# Patient Record
Sex: Female | Born: 1960 | Race: Black or African American | Hispanic: No | Marital: Married | Smoking: Never smoker
Health system: Southern US, Community
[De-identification: ages and names within clinical notes are randomized; demographics above are authoritative.]

## PROBLEM LIST (undated history)

## (undated) HISTORY — PX: KNEE SURGERY: SHX244

## (undated) HISTORY — PX: FOOT SURGERY: SHX648

## (undated) HISTORY — PX: NM ESOPHAGEAL REFLUX: HXRAD613

---

## 2014-10-28 ENCOUNTER — Ambulatory Visit
Admission: RE | Admit: 2014-10-28 | Discharge: 2014-10-28 | Disposition: A | Discharge status: Home / Self Care | Payer: Federal, State, Local not specified - PPO | Source: Ambulatory Visit | Attending: Family Medicine | Admitting: Family Medicine

## 2014-10-28 ENCOUNTER — Other Ambulatory Visit: Payer: Self-pay | Admitting: Family Medicine

## 2014-10-28 DIAGNOSIS — R05 Cough: Secondary | ICD-10-CM

## 2014-10-28 DIAGNOSIS — R053 Chronic cough: Secondary | ICD-10-CM

## 2015-01-15 ENCOUNTER — Other Ambulatory Visit: Payer: Self-pay | Admitting: Family Medicine

## 2015-01-15 DIAGNOSIS — Z1231 Encounter for screening mammogram for malignant neoplasm of breast: Secondary | ICD-10-CM

## 2015-02-27 ENCOUNTER — Ambulatory Visit: Payer: Federal, State, Local not specified - PPO

## 2015-03-02 ENCOUNTER — Ambulatory Visit: Payer: Federal, State, Local not specified - PPO

## 2015-03-03 ENCOUNTER — Ambulatory Visit
Admission: RE | Admit: 2015-03-03 | Discharge: 2015-03-03 | Disposition: A | Discharge status: Home / Self Care | Payer: Federal, State, Local not specified - PPO | Source: Ambulatory Visit | Attending: Family Medicine | Admitting: Family Medicine

## 2015-03-03 DIAGNOSIS — Z1231 Encounter for screening mammogram for malignant neoplasm of breast: Secondary | ICD-10-CM

## 2015-04-13 ENCOUNTER — Ambulatory Visit (INDEPENDENT_AMBULATORY_CARE_PROVIDER_SITE_OTHER): Payer: Federal, State, Local not specified - PPO | Admitting: Podiatry

## 2015-04-13 ENCOUNTER — Encounter: Payer: Self-pay | Admitting: Podiatry

## 2015-04-13 VITALS — BP 142/90 | HR 74 | Resp 16 | Ht 68.0 in | Wt 230.0 lb

## 2015-04-13 DIAGNOSIS — M722 Plantar fascial fibromatosis: Secondary | ICD-10-CM

## 2015-04-13 DIAGNOSIS — M7732 Calcaneal spur, left foot: Secondary | ICD-10-CM | POA: Diagnosis not present

## 2015-04-13 DIAGNOSIS — M898X9 Other specified disorders of bone, unspecified site: Secondary | ICD-10-CM

## 2015-04-13 DIAGNOSIS — M799 Soft tissue disorder, unspecified: Secondary | ICD-10-CM

## 2015-04-13 DIAGNOSIS — M7989 Other specified soft tissue disorders: Secondary | ICD-10-CM

## 2015-04-13 NOTE — Progress Notes (Signed)
Subjective:    Patient ID: Daisy [name], female    DOB: 1960-10-28, 54 y.o.   MRN: [id number]  HPI Comments: "I was sent over by my other podiatrist"  Patient states that she is referred by Dr. Elijah Birk for a surgical consults of plantar fasciitis/heel spur and dorsal ganglion cyst left. She states that she's had pain in her left heel for several months. She also states that she has a heel spur to the area. She is tried multiple conservative treatments including multiple injections, CAM walkers, night splints, injections. She states it always treatment she gets temporary relief from the heel pain however it does recur. She also states that she has assistive the top of her left foot which shows steroid injection of the area does continue be painful particularly with shoe gear. She has tried offloading. The injection helped temporarily. At this time she is a pleasant surgical interventions for both of these issues due to continued pain. She is referred to me by Dr. Elijah Birk for consultation.  Foot Pain Associated symptoms include arthralgias.      Review of Systems  HENT: Positive for sinus pressure.   Eyes: Positive for redness and itching.  Gastrointestinal: Positive for abdominal distention.  Musculoskeletal: Positive for back pain and arthralgias.  Allergic/Immunologic: Positive for environmental allergies.  All other systems reviewed and are negative.      Objective:   Physical Exam AAO x3, NAD DP/PT pulses palpable bilaterally, CRT less than 3 seconds Protective sensation intact with Simms Weinstein monofilament, vibratory sensation intact, Achilles tendon reflex intact Tenderness to palpation overlying the plantar medial tubercle of the calcaneus to left heel at the insertion of the plantar fascia. There is no pain along the course of plantar fascia within the arch of the foot. The plantar fascia appears intact. There is no pain with lateral compression of the calcaneus or pain the vibratory  sensation. No pain on the posterior aspect of the calcaneus or along the course/insertion of the Achilles tendon.  On the dorsal aspect the left midfoot there is a small fluid-filled, mobile soft tissue mass present lateral to the dorsalis pedis artery. Underlying this area does appear to be a bony exostosis. There is tenderness palpation overlying this area. There is no overlying erythema or increase in warmth. No overlying skin changes.  There is no overlying edema, erythema, increase in warmth. No other areas of tenderness palpation or pain with vibratory sensation to the foot/ankle. MMT 5/5, ROM WNL No open lesions or pre-ulcerative lesions are identified. No pain with calf compression, swelling, warmth, erythema.      Assessment & Plan:  54 year old female with continued left foot plantar fasciitis, heel spur despite conservative treatment as well as left dorsal foot exostosis, soft tissue mass. -X-rays and Dr. Tasia Catchings office were reviewed with the patient. -Treatment options discussed including all alternatives, risks, and complications I discussed both conservative and surgical treatment options. At this time she is requesting surgical intervention as she is attended conservative treatment without any resolution. -Discussed with the patient left foot plantar fascial release with heel spur resection, dorsal foot exostectomy with soft tissue mass excision.  -The incision placement as well as the postoperative course was discussed with the patient. I discussed risks of the surgery which include, but not limited to, infection, bleeding, pain, swelling, need for further surgery, delayed or nonhealing, painful or ugly scar, numbness or sensation changes, over/under correction, recurrence, transfer lesions, further deformity, hardware failure, DVT/PE, loss of toe/foot. Patient understands these  risks and wishes to proceed with surgery. The surgical consent was reviewed with the patient all 3 pages were  signed. No promises or guarantees were given to the outcome of the procedure. All questions were answered to the best of my ability. Before the surgery the patient was encouraged to call the office if there is any further questions. The surgery will be performed at the [facility] on an outpatient basis.  Ovid CurdMatthew Saher Davee, DPM

## 2015-04-13 NOTE — Patient Instructions (Signed)
Pre-Operative Instructions  Congratulations, you have decided to take an important step to improving your quality of life.  You can be assured that the doctors of [facility] will be with you every step of the way.  1. Plan to be at the surgery center/hospital at least 1 (one) hour prior to your scheduled time unless otherwise directed by the surgical center/hospital staff.  You must have a responsible adult accompany you, remain during the surgery and drive you home.  Make sure you have directions to the surgical center/hospital and know how to get there on time. 2. For hospital based surgery you will need to obtain a history and physical form from your family physician within 1 month prior to the date of surgery- we will give you a form for you primary physician.  3. We make every effort to accommodate the date you request for surgery.  There are however, times where surgery dates or times have to be moved.  We will contact you as soon as possible if a change in schedule is required.   4. No Aspirin/Ibuprofen for one week before surgery.  If you are on aspirin, any non-steroidal anti-inflammatory medications (Mobic, Aleve, Ibuprofen) you should stop taking it 7 days prior to your surgery.  You make take Tylenol  For pain prior to surgery.  5. Medications- If you are taking daily heart and blood pressure medications, seizure, reflux, allergy, asthma, anxiety, pain or diabetes medications, make sure the surgery center/hospital is aware before the day of surgery so they may notify you which medications to take or avoid the day of surgery. 6. No food or drink after midnight the night before surgery unless directed otherwise by surgical center/hospital staff. 7. No alcoholic beverages 24 hours prior to surgery.  No smoking 24 hours prior to or 24 hours after surgery. 8. Wear loose pants or shorts- loose enough to fit over bandages, boots, and casts. 9. No slip on shoes, sneakers are best. 10. Bring  your boot with you to the surgery center/hospital.  Also bring crutches or a walker if your physician has prescribed it for you.  If you do not have this equipment, it will be provided for you after surgery. 11. If you have not been contracted by the surgery center/hospital by the day before your surgery, call to confirm the date and time of your surgery. 12. Leave-time from work may vary depending on the type of surgery you have.  Appropriate arrangements should be made prior to surgery with your employer. 13. Prescriptions will be provided immediately following surgery by your doctor.  Have these filled as soon as possible after surgery and take the medication as directed. 14. Remove nail polish on the operative foot. 15. Wash the night before surgery.  The night before surgery wash the foot and leg well with the antibacterial soap provided and water paying special attention to beneath the toenails and in between the toes.  Rinse thoroughly with water and dry well with a towel.  Perform this wash unless told not to do so by your physician.  Enclosed: 1 Ice pack (please put in freezer the night before surgery)   1 Hibiclens skin cleaner   Pre-op Instructions  If you have any questions regarding the instructions, do not hesitate to call our office.  [place]: [address] [place], [place] [address] [phone]  [place]: [address] [place], [place] [address] [phone]  [place]: [address]  [place], [place] [address] [phone]  Dr. Richard   Tuchman DPM, Dr. Norman Regal DPM Dr. Richard Sikora DPM, Dr. M. Todd Hyatt DPM, Dr. Kathryn Egerton DPM, Dr. Tanja Gift DPM 

## 2015-05-12 ENCOUNTER — Telehealth: Payer: Self-pay | Admitting: *Deleted

## 2015-05-12 NOTE — Telephone Encounter (Signed)
I left a message for patient to give me a call back.  I was returning her call.

## 2015-05-12 NOTE — Telephone Encounter (Signed)
"  I have an appointment for 09/07 that I need to get rescheduled.  Please give me a call at your earliest convenience.  Thank you."

## 2015-05-14 NOTE — Telephone Encounter (Signed)
"  I was just returning your call.  I was calling to get my surgery rescheduled that is scheduled for 09/07.  Call me at your earliest convenience, thank you."  I attempted to return her call.  I left a message that I was returning her call.

## 2015-05-14 NOTE — Telephone Encounter (Signed)
"  I'd like to move my surgery to sometime in October.  Can he do it on 07/06/2015?  I know that's a holiday and you may be closed."   We are working that day but he can't do it then.  He can do it on 07/08/2015.  His surgery day is on Wednesdays.  "Okay that is fine or I can do 07/01/2015.  Is that date available?"  Yes, it is available.  Would you like that date?  "Yes schedule me then.  What time will it be?"  The surgical center will call you a day or two prior to surgery date and give you the arrival time.  All I can tell you is that it will be sometime that morning.  I left Aram Beecham at the surgical center a message to reschedule patient's surgery from 06/03/2015 to 07/01/2015.

## 2015-06-08 ENCOUNTER — Encounter: Payer: Self-pay | Admitting: Podiatry

## 2015-06-19 ENCOUNTER — Encounter: Payer: Self-pay | Admitting: Podiatry

## 2015-07-01 DIAGNOSIS — M7732 Calcaneal spur, left foot: Secondary | ICD-10-CM

## 2015-07-01 DIAGNOSIS — M722 Plantar fascial fibromatosis: Secondary | ICD-10-CM

## 2015-07-02 ENCOUNTER — Telehealth: Payer: Self-pay | Admitting: *Deleted

## 2015-07-02 NOTE — Telephone Encounter (Signed)
Pt states Dr. Ardelle Anton did not speak with her husband, and they have some questions, and she would like a crutch. I spoke with pt she states she thought she would get a crutch, but didn't.  I told pt she should walk as normal as possible in the surgery shoe, not up on the surgery foot more than 5 minutes/hour and could use a cane if available or rent crutch for [employer].  Pt agreed.

## 2015-07-06 ENCOUNTER — Ambulatory Visit (INDEPENDENT_AMBULATORY_CARE_PROVIDER_SITE_OTHER): Payer: Federal, State, Local not specified - PPO | Admitting: Podiatry

## 2015-07-06 ENCOUNTER — Telehealth: Payer: Self-pay | Admitting: *Deleted

## 2015-07-06 ENCOUNTER — Encounter: Payer: Self-pay | Admitting: Podiatry

## 2015-07-06 ENCOUNTER — Ambulatory Visit (INDEPENDENT_AMBULATORY_CARE_PROVIDER_SITE_OTHER): Payer: Federal, State, Local not specified - PPO

## 2015-07-06 VITALS — BP 136/74 | HR 86 | Resp 18

## 2015-07-06 DIAGNOSIS — Z9889 Other specified postprocedural states: Secondary | ICD-10-CM

## 2015-07-06 DIAGNOSIS — M898X9 Other specified disorders of bone, unspecified site: Secondary | ICD-10-CM

## 2015-07-06 DIAGNOSIS — M722 Plantar fascial fibromatosis: Secondary | ICD-10-CM

## 2015-07-06 DIAGNOSIS — M7732 Calcaneal spur, left foot: Secondary | ICD-10-CM

## 2015-07-06 MED ORDER — OXYCODONE-ACETAMINOPHEN 5-325 MG PO TABS: 1.0000 | ORAL_TABLET | Freq: Four times a day (QID) | ORAL | Status: DC | PRN

## 2015-07-06 NOTE — Telephone Encounter (Signed)
Willis called states pt is in severe pain.  I called spoke with pt, she states the top of her foot hurts terribly.  I told pt to loosen the boot straps, and open up the boot and loosen the dressing and reapply the straps more comfortably, and she could take the Oxycodone 2 tablet every 6 hours, to remember she'd been up on that foot today and it would cause swelling, to rest more this evening.  Pt agreed.

## 2015-07-06 NOTE — Progress Notes (Signed)
Patient ID: Daisy [name], female   DOB: 1961-01-06, 54 y.o.   MRN: [id number]  DOS: 07/01/15 s/p Left plantar fasciotomy, heel spur resection   Subjective: 54 year old female presents to the office today 1 week s/p left foot surgery. She states she has continued to take the antibiotics as directed. She takes Percocet for pain which seems to help her pain. She does walk with a cane to help take pressure off the foot. She continues to wear the CAM boot. She does state that her leg continues to be somewhat numb for the nerve block. She denies any systemic complaints as fevers, chills, nausea, vomiting. Denies any calf pain, chest pain, shortness of breath. No other complaints at this time in no acute changes otherwise.  Objective: AAO 3, NAD DP/PT pulses palpable, CRT less than 3 seconds Protective sensation intact with Simms Weinstein monofilament, vibratory sensation intact, Achilles tendon reflex intact. Incision on both the dorsal aspect of the left foot as well as the medial aspect of the left barefoot as well coapted without any evidence of dehiscence and sutures are intact. There is no surrounding erythema, ascending cellulitis, fluctuance, crepitus, malodor, drainage/purulence. There is localized edema around the surgical sites and mild tenderness to palpation about surgical sites. There is no other areas of tenderness to bilateral lower extremities. No other areas of edema, erythema, increase in warmth. No open lesions or pre-ulcerative lesions bilaterally. There is no pain with calf compression, swelling, warmth, erythema.  Assessment: 54 year old female 1 week status post left foot surgery  Plan: -X-rays were obtained and reviewed with the patient.  -Treatment options discussed including all alternatives, risks, and complications -Antibiotic ointments placed over the incisions followed by dry sterile dressing. Keep dressing clean, dry, intact. -At her request crutches were ordered. She'll be  partial weightbearing or she needs to be nonweightbearing. -Continue ice and elevation -Pian medication as needed -Monitor for any clinical signs or symptoms of infection and directed to call the office immediately should any occur or go to the ER. -Follow-up in 1 week for likely suture removal or sooner if any problems arise. In the meantime, encouraged to call the office with any questions, concerns, change in symptoms.   Ovid Curd, DPM

## 2015-07-17 ENCOUNTER — Ambulatory Visit (INDEPENDENT_AMBULATORY_CARE_PROVIDER_SITE_OTHER): Payer: Federal, State, Local not specified - PPO | Admitting: Podiatry

## 2015-07-17 ENCOUNTER — Encounter: Payer: Self-pay | Admitting: Podiatry

## 2015-07-17 VITALS — BP 134/80 | HR 82 | Resp 18

## 2015-07-17 DIAGNOSIS — Z9889 Other specified postprocedural states: Secondary | ICD-10-CM

## 2015-07-17 DIAGNOSIS — M722 Plantar fascial fibromatosis: Secondary | ICD-10-CM

## 2015-07-17 MED ORDER — OXYCODONE-ACETAMINOPHEN 5-325 MG PO TABS: 1.0000 | ORAL_TABLET | Freq: Four times a day (QID) | ORAL | Status: DC | PRN

## 2015-07-23 NOTE — Progress Notes (Signed)
Patient ID: Daisy [name], female   DOB: 09/15/1961, 54 y.o.   MRN: [id number]  DOS: 07/01/15 s/p Left plantar fasciotomy, heel spur resection   Subjective: 54 year old female presents to the office today 2 weeks s/p left foot surgery. She states that overall she is doing well and her pain is continuing to improve. She continues to get some swelling overlying the area as well as some localized numbness and tingling along the surgical site. She continues take pain medication as needed. She denies any systemic complaints such as fevers, chills, nausea, vomiting. Denies any calf pain, chest pain, shortness of breath. No other complaints at this time in no acute changes otherwise.  Objective: AAO 3, NAD DP/PT pulses palpable, CRT less than 3 seconds Protective sensation intact with Simms Weinstein monofilament Incision on both the dorsal aspect of the left foot as well as the medial aspect of the left foot are well coapted without any evidence of dehiscence and sutures are intact. There is no surrounding erythema, ascending cellulitis, fluctuance, crepitus, malodor, drainage/purulence. There is localized edema around the surgical sites and mild tenderness to palpation about surgical sites which continues but appears to be somewhat improved compared to last appointment. There are no other areas of tenderness to bilateral lower extremities. No other areas of edema, erythema, increase in warmth. No open lesions or pre-ulcerative lesions bilaterally. There is no pain with calf compression, swelling, warmth, erythema.  Assessment: 54 year old female 2 weeks status post left foot surgery  Plan: -Treatment options discussed including all alternatives, risks, and complications -Sutures were removed. Antibiotic ointments placed over the incisions followed by dry sterile dressing. Keep dressing clean, dry, intact.she can start to shower as long as the incision remains closed. At the same prominence to hold off on  showing call the office. -continue weightbearing as tolerated in the boot. Wear the boot all times, even at night. -Continue ice and elevation -Pian medication as needed -Monitor for any clinical signs or symptoms of infection and directed to call the office immediately should any occur or go to the ER. -Follow-up in 3 weeks or sooner if any problems arise. In the meantime, encouraged to call the office with any questions, concerns, change in symptoms.   Ovid CurdMatthew Ebb Carelock, DPM

## 2015-08-05 ENCOUNTER — Telehealth: Payer: Self-pay | Admitting: *Deleted

## 2015-08-05 MED ORDER — OXYCODONE-ACETAMINOPHEN 5-325 MG PO TABS: 1.0000 | ORAL_TABLET | Freq: Four times a day (QID) | ORAL | Status: DC | PRN

## 2015-08-05 MED ORDER — PROMETHAZINE HCL 25 MG PO TABS: 25.0000 mg | ORAL_TABLET | Freq: Four times a day (QID) | ORAL | Status: DC | PRN

## 2015-08-05 NOTE — Telephone Encounter (Signed)
OK to refill

## 2015-08-05 NOTE — Telephone Encounter (Addendum)
Pt request refill of Oxycodone and Phenergan.  Informed pt Dr. Ardelle AntonWagoner had refilled and she would need to pick [facility], pt states she will pick up tomorrow.

## 2015-08-07 ENCOUNTER — Encounter: Payer: Federal, State, Local not specified - PPO | Admitting: Podiatry

## 2015-08-14 ENCOUNTER — Ambulatory Visit (INDEPENDENT_AMBULATORY_CARE_PROVIDER_SITE_OTHER): Payer: Federal, State, Local not specified - PPO | Admitting: Podiatry

## 2015-08-14 ENCOUNTER — Ambulatory Visit: Payer: Federal, State, Local not specified - PPO

## 2015-08-14 ENCOUNTER — Encounter: Payer: Self-pay | Admitting: Podiatry

## 2015-08-14 VITALS — BP 151/99 | HR 80 | Temp 99.3°F | Resp 12

## 2015-08-14 DIAGNOSIS — Z9889 Other specified postprocedural states: Secondary | ICD-10-CM

## 2015-08-14 DIAGNOSIS — M722 Plantar fascial fibromatosis: Secondary | ICD-10-CM

## 2015-08-14 DIAGNOSIS — M79673 Pain in unspecified foot: Secondary | ICD-10-CM

## 2015-08-14 MED ORDER — CEPHALEXIN 500 MG PO CAPS: 500.0000 mg | ORAL_CAPSULE | Freq: Three times a day (TID) | ORAL | Status: DC

## 2015-08-16 DIAGNOSIS — Z9889 Other specified postprocedural states: Secondary | ICD-10-CM | POA: Insufficient documentation

## 2015-08-16 DIAGNOSIS — M722 Plantar fascial fibromatosis: Secondary | ICD-10-CM | POA: Insufficient documentation

## 2015-08-16 NOTE — Progress Notes (Signed)
Patient ID: Daisy [name], female   DOB: 04/04/1961, 54 y.o.   MRN: [id number]  Subjective: Daisy [name] is a 54 y.o. is seen today in office s/p left plantar fasciotomy and heel spur resection as well as dorsal exostectomy preformed on 07/01/15. She states that she is still having some pain on the surgical site. She is also had increased swelling around the incision into the bottom of her foot. She has continued with the cam boot. She did notice some clear drainage coming from the incision but denies any pus. Denies any increase in warmth or redness around the incision which of the foot. No red streaks. Denies any systemic complaints such as fevers, chills, nausea, vomiting. No calf pain, chest pain, shortness of breath.   Objective: General: No acute distress, AAOx3  DP/PT pulses palpable 2/4, CRT < 3 sec to all digits.  Protective sensation intact. Motor function intact.  Left foot: Incision on the medial aspect of the foot has what appears to be a very small superficial granular wound which almost appears that the scab has fallen off and there is left with a small superficial wound. Also there is increased edema around the medial aspect of the heel on the surgical site into the plantar aspect of the heel and due to the swelling may open the incision somewhat. There was no drainage or pus expressed today. There is tenderness palpation overlying this area. There is no erythema or increase in warmth. There is no red streaks. No malodor.  Incision on the dorsal aspect of the left foot as well coapted without any evidence of dehiscence and a scars forming. There is no significant edema around this area or surrounding erythema, increase in warmth. There is no drainage or pus. No other areas of tenderness to bilateral lower extremities.  No other open lesions or pre-ulcerative lesions.  No pain with calf compression, swelling, warmth, erythema.   Assessment and Plan:  Status post left foot surgery, with  increased swelling/tenderness overlying the medial incision  -Treatment options discussed including all alternatives, risks, and complications -Antibiotic ointment was placed over the incision followed by a dressing. An Unna boot was applied. She'll remove the boot on Tuesday [name] or sooner if there is any swelling, increasing pain, discoloration of the toes. If there is any worsening of the incision or there is any signs or symptoms of infection to call the office as I'm in the office Tuesday afternoon. She can always come in before this if needed as well. -Ice/elevation -Pain medication as needed. -Monitor for any clinical signs or symptoms of infection and DVT/PE and directed to call the office immediately should any occur or go to the ER. -Follow-up in 1 week or sooner if any problems arise. In the meantime, encouraged to call the office with any questions, concerns, change in symptoms.   Ovid CurdMatthew Corney Knighton, DPM

## 2015-08-17 ENCOUNTER — Telehealth: Payer: Self-pay | Admitting: *Deleted

## 2015-08-17 MED ORDER — OXYCODONE-ACETAMINOPHEN 5-325 MG PO TABS: 1.0000 | ORAL_TABLET | Freq: Four times a day (QID) | ORAL | Status: DC | PRN

## 2015-08-17 NOTE — Telephone Encounter (Addendum)
Pt request refill of Oxycodone.  Dr. Ardelle AntonWagoner ordered refill as previously.  Informed pt she would need to pick up the rx in the [facility] [place].  Pt states has removed the medicated wrap and the foot is still swollen, but not as bad, the area is no move closed than last time and there is clear drainage.  Dr. Ardelle AntonWagoner states continue the epsom salt soaks, compression, elevation and ice, continue the Cephalexin and begin Voltaren 75mg  #60 1 tablet bid.  Orders to pt and pharmacy.

## 2015-08-18 MED ORDER — DICLOFENAC SODIUM 75 MG PO TBEC: 75.0000 mg | DELAYED_RELEASE_TABLET | Freq: Two times a day (BID) | ORAL | Status: DC

## 2015-08-24 ENCOUNTER — Encounter: Payer: Self-pay | Admitting: Podiatry

## 2015-08-24 ENCOUNTER — Ambulatory Visit (INDEPENDENT_AMBULATORY_CARE_PROVIDER_SITE_OTHER): Payer: Federal, State, Local not specified - PPO | Admitting: Podiatry

## 2015-08-24 DIAGNOSIS — M722 Plantar fascial fibromatosis: Secondary | ICD-10-CM

## 2015-08-24 DIAGNOSIS — M898X9 Other specified disorders of bone, unspecified site: Secondary | ICD-10-CM

## 2015-08-24 DIAGNOSIS — Z9889 Other specified postprocedural states: Secondary | ICD-10-CM

## 2015-08-24 MED ORDER — OXYCODONE-ACETAMINOPHEN 5-325 MG PO TABS: 1.0000 | ORAL_TABLET | Freq: Four times a day (QID) | ORAL | Status: DC | PRN

## 2015-08-24 MED ORDER — METHYLPREDNISOLONE 4 MG PO TBPK: ORAL_TABLET | ORAL | Status: DC

## 2015-08-25 NOTE — Progress Notes (Signed)
Patient ID: Daisy GoldsVeda Cuneo, female   DOB: 06/06/1961, 54 y.o.   MRN: [id number]  Subjective: Daisy [name] is a 54 y.o. is seen today in office s/p left plantar fasciotomy and heel spur resection as well as dorsal exostectomy preformed on 07/01/15. She states that she is still having some pain on the surgical site although it has improved compared to last appointment. She is also had decreased swelling around the incision into the bottom of her foot since last appointment. She has continued with the cam boot. She states the drainage has stopped along the incision. She denies any surrounding redness or increase in warmth around the incision or to the foot/ankle. No red streaks. She has one more day for antibiotic. Denies any systemic complaints such as fevers, chills, nausea, vomiting. No calf pain, chest pain, shortness of breath.   Objective: General: No acute distress, AAOx3  DP/PT pulses palpable 2/4, CRT < 3 sec to all digits.  Protective sensation intact. Motor function intact.  Left foot: Incision on the medial aspect of the foot has what appears to coapted at this time there is no evidence of dehiscence. There is no drainage or pus expressed today. There is tenderness palpation overlying this area but subjectively is improving. There is no erythema or increase in warmth. There is no red streaks. No malodor.  there is decreased edema along the surgical sites the plantar fascia.  Incision on the dorsal aspect of the left foot as well coapted without any evidence of dehiscence and a scars forming. There is no significant edema around this area or surrounding erythema, increase in warmth. There is no drainage or pus. No other areas of tenderness to bilateral lower extremities.  No other open lesions or pre-ulcerative lesions.  No pain with calf compression, swelling, warmth, erythema.   Assessment and Plan:  Status post left foot surgery, with decreasingling/tenderness overlying the medial  incision  -Treatment options discussed including all alternatives, risks, and complications -Antibiotic ointment was placed over the incision followed by a dressing. An Unna boot was applied. She'll remove the boot in 5 days or sooner if there is any swelling, increasing pain, discoloration of the toes. If there is any worsening of the incision or there is any signs or symptoms of infection to call the office as I'm in the office Tuesday afternoon. She can always come in before this if needed as well. -Ice/elevation -Pain medication as needed. -Medrol dose pack -Rx PT -Monitor for any clinical signs or symptoms of infection and DVT/PE and directed to call the office immediately should any occur or go to the ER. -Follow-up in 2 weeks or sooner if any problems arise. In the meantime, encouraged to call the office with any questions, concerns, change in symptoms.   Ovid CurdMatthew Wagoner, DPM

## 2015-08-28 ENCOUNTER — Telehealth: Payer: Self-pay | Admitting: Podiatry

## 2015-08-28 NOTE — Telephone Encounter (Signed)
I called and left a message for the patient to let her know that her medical records are ready. I told her she has a fee of $6.75 to pay for them. I asked her to call and let me know if she wants to come pick them up or if she prefers I mail them to her.

## 2015-09-11 ENCOUNTER — Encounter: Payer: Self-pay | Admitting: Podiatry

## 2015-09-11 ENCOUNTER — Ambulatory Visit (INDEPENDENT_AMBULATORY_CARE_PROVIDER_SITE_OTHER): Payer: Federal, State, Local not specified - PPO | Admitting: Podiatry

## 2015-09-11 VITALS — BP 141/92 | HR 80 | Resp 18

## 2015-09-11 DIAGNOSIS — M722 Plantar fascial fibromatosis: Secondary | ICD-10-CM

## 2015-09-11 DIAGNOSIS — Z9889 Other specified postprocedural states: Secondary | ICD-10-CM

## 2015-09-11 DIAGNOSIS — M898X9 Other specified disorders of bone, unspecified site: Secondary | ICD-10-CM

## 2015-09-11 MED ORDER — OXYCODONE-ACETAMINOPHEN 5-325 MG PO TABS: 1.0000 | ORAL_TABLET | Freq: Four times a day (QID) | ORAL | Status: DC | PRN

## 2015-09-11 NOTE — Telephone Encounter (Signed)
Patient has a scheduled appointment today 09/11/2015 at 10:45 with Dr. Ardelle AntonWagoner. I let them know she needs to pay $6.75 for her records and I scanned a [facility] of them [facility] for her to print out to give to the patient since I never heard back from the patient.

## 2015-09-14 DIAGNOSIS — M898X9 Other specified disorders of bone, unspecified site: Secondary | ICD-10-CM | POA: Insufficient documentation

## 2015-09-14 NOTE — Progress Notes (Signed)
Patient ID: Daisy [name], female   DOB: 09/08/1961, 54 y.o.   MRN: [id number]  Subjective: Daisy [name] is a 54 y.o. is seen today in office s/p left plantar fasciotomy and heel spur resection as well as dorsal exostectomy preformed on 07/01/15. She states of the swan the pain has decreased all as she does continue to have pain mostly on the medial incision. She does start physical therapy on Monday. She went from her evaluation is past week. She does continue to anklet the CAM boot. She has had no further drainage coming from the incision. Denies any redness or red streaks. She is really not taking antibiotics. Denies any systemic complaints such as fevers, chills, nausea, vomiting. No calf pain, chest pain, shortness of breath.   Objective: General: No acute distress, AAOx3  DP/PT pulses palpable 2/4, CRT < 3 sec to all digits.  Protective sensation intact. Motor function intact.  Left foot: Incision on the medial aspect of the foot has what appears to coapted at this time there is no evidence of dehiscence and a scar has formed. There is no drainage or pus expressed today. There is tenderness palpation overlying this area but improved. There is no erythema or increase in warmth. There is no red streaks. No malodor.  There is decreased edema. Incision on the dorsal aspect of the left foot as well coapted without any evidence of dehiscence and a scars forming. There is no significant edema around this area or surrounding erythema, increase in warmth. There is no drainage or pus. No other areas of tenderness to bilateral lower extremities.  No other open lesions or pre-ulcerative lesions.  No pain with calf compression, swelling, warmth, erythema.   Assessment and Plan:  Status post left foot surgery, with decreasingling/tenderness overlying the medial incision  -Treatment options discussed including all alternatives, risks, and complications -Recommended rehabilitation exercises home. She starts  physical therapy on Monday. Continuing the CAM boot. She inserted transition out of the CAM boot back into a regular shoe as tolerated. With the boot even at night or the night splint. Ice and elevation. Pain medication as needed. She recently completed a Medrol Dosepak without any couple complications. Follow up in 3 weeks or sooner if any problems are to arise. Call any questions or concerns. Monitor for any clinical signs or symptoms of infection and directed to call the office immediately should any occur or go to the ER.  Ovid CurdMatthew Wagoner, DPM

## 2015-09-21 NOTE — Telephone Encounter (Signed)
Entered in error

## 2015-09-24 ENCOUNTER — Encounter: Payer: Self-pay | Admitting: Podiatry

## 2015-09-24 NOTE — Progress Notes (Signed)
DOS 07-01-15  Plantar fasciotomy left, heel spur resection left, tarsal exostectomy left  Rx'd Keflex 500 mg TID #21, Percocet 5/325 #30, Phenergan 25 mg #30

## 2015-10-06 ENCOUNTER — Encounter: Payer: Self-pay | Admitting: Podiatry

## 2015-10-06 ENCOUNTER — Ambulatory Visit (INDEPENDENT_AMBULATORY_CARE_PROVIDER_SITE_OTHER): Payer: Federal, State, Local not specified - PPO | Admitting: Podiatry

## 2015-10-06 VITALS — BP 134/91 | HR 79 | Resp 18

## 2015-10-06 DIAGNOSIS — M898X9 Other specified disorders of bone, unspecified site: Secondary | ICD-10-CM

## 2015-10-06 DIAGNOSIS — Z9889 Other specified postprocedural states: Secondary | ICD-10-CM

## 2015-10-06 DIAGNOSIS — M722 Plantar fascial fibromatosis: Secondary | ICD-10-CM | POA: Diagnosis not present

## 2015-10-06 NOTE — Progress Notes (Signed)
Patient ID: Daisy [name], female   DOB: 05/17/1961, 55 y.o.   MRN: [id number]  Subjective: Daisy GoldsVeda Digiacomo is a 55 y.o. is seen today in office s/p left plantar fasciotomy and heel spur resection as well as dorsal exostectomy preformed on 07/01/15. She states that she feels that she is improving. She's been going to physical therapy which has been helping. She does continue the cam boot she does sleep in the cam boot at night. She doesn't that her leg is still not since the surgery as well. It has slowly been getting better. Denies any systemic complaints such as fevers, chills, nausea, vomiting. No calf pain, chest pain, shortness of breath.   Objective: General: No acute distress, AAOx3  DP/PT pulses palpable 2/4, CRT < 3 sec to all digits.  Protective sensation intact. Motor function intact.  Left foot: Incision on the medial aspect of the foot appears to coapted at this time there is no evidence of dehiscence and a scar has formed. There is no drainage or pus expressed  No open sores. Tenderness over the incision does appear to be decreased. There is also decreased tenderness of plantar aspect of the heel along the plantar fascia and the calcaneus. There doesn't appear to be some subjective numbness overlying the distal leg.  Incision on the dorsal aspect of the midfoot from  exostectomy is well-healed and there is no tenderness upon area.There is mild edema to the foot however does appear to be improved. No other areas of tenderness to bilateral lower extremities. No other open lesions or pre-ulcerative lesions.  No pain with calf compression, swelling, warmth, erythema.   Assessment and Plan:  Status post left foot surgery, with improving symptoms although discontinued have numbness and tingling to the leg after nerve block.  -Treatment options discussed including all alternatives, risks, and complications -At this time I do recommend he continue with physical therapy. Continue the cam boot as needed  she can start to transition to regular shoe as tolerated. Dispensed compression anklet to help with swelling.  She did finish the Medrol Dosepak which helps. She did not get the compound cream which I ordered previously I will reorder this. Faxed to [facility]. She cannot take anti-inflammatories. -Will speak with anesthesia about the nerve sensations to the leg from the nerve block. -Continue to sleep with night splint or CAM boot. Dispensed night splint today. -Monitor for any clinical signs or symptoms of infection and directed to call the office immediately should any occur or go to the ER. -Follow-up in 3 weeks or sooner if any problems arise. In the meantime, encouraged to call the office with any questions, concerns, change in symptoms.   Ovid CurdMatthew Wagoner, DPM

## 2015-10-07 ENCOUNTER — Telehealth: Payer: Self-pay | Admitting: *Deleted

## 2015-10-07 MED ORDER — NONFORMULARY OR COMPOUNDED ITEM: Status: DC

## 2015-10-07 NOTE — Telephone Encounter (Signed)
Dr. Ardelle AntonWagoner ordered [facility] compound - Achilles Tendonitis Cream.  Faxed.

## 2015-10-07 NOTE — Telephone Encounter (Signed)
OK to do the neuropathy cream.

## 2015-10-07 NOTE — Telephone Encounter (Addendum)
[facility] states [employer] insurance will pay more if the peripheral neuropathy compound is ordered, Peripheral Neuropathy compound containing Bupivacaine 1%, Doxepin 3%, Gabapentin 6%, Pentoxifylline 3%, Topiramate 1%.  10/08/2015 - Dr. Ardelle AntonWagoner states may change to Peripheral Neuropathy cream.  [facility]

## 2015-10-08 MED ORDER — NONFORMULARY OR COMPOUNDED ITEM: Status: DC

## 2015-10-30 ENCOUNTER — Encounter: Payer: Self-pay | Admitting: Podiatry

## 2015-10-30 ENCOUNTER — Ambulatory Visit (INDEPENDENT_AMBULATORY_CARE_PROVIDER_SITE_OTHER): Payer: Federal, State, Local not specified - PPO | Admitting: Podiatry

## 2015-10-30 VITALS — BP 128/77 | HR 76 | Resp 18

## 2015-10-30 DIAGNOSIS — M722 Plantar fascial fibromatosis: Secondary | ICD-10-CM | POA: Diagnosis not present

## 2015-10-30 DIAGNOSIS — M898X9 Other specified disorders of bone, unspecified site: Secondary | ICD-10-CM

## 2015-10-30 DIAGNOSIS — Z9889 Other specified postprocedural states: Secondary | ICD-10-CM

## 2015-10-30 DIAGNOSIS — M79672 Pain in left foot: Secondary | ICD-10-CM | POA: Diagnosis not present

## 2015-10-31 NOTE — Progress Notes (Signed)
Patient ID: Daisy [name], female   DOB: August 13, 1961, 55 y.o.   MRN: [id number]  Subjective: Daisy [name] is a 55 y.o. is seen today in office s/p left plantar fasciotomy and heel spur resection as well as dorsal exostectomy preformed on 07/01/15. She has been going to physical therapy since her last appointment. She feels that the physical therapy is helping her quite a bit and she does feel that she is making steady improvement. She does continue with the cam boot. She feels that she does get pain to the arch of her foot as well. The pain to her heels improving. She does continue sleep the night splint. Denies any systemic complaints such as fevers, chills, nausea, vomiting. No calf pain, chest pain, shortness of breath.   Objective: General: No acute distress, AAOx3  DP/PT pulses palpable 2/4, CRT < 3 sec to all digits.  Protective sensation intact. Motor function intact.  Left foot: Incision on the medial aspect of the foot appears to coapted at this time there is no evidence of dehiscence and a scar has formed. There is no drainage or pus expressed  No open sores. Tenderness over the incision does appear to be continuing to improve. There is also decreased tenderness of plantar aspect of the heel along the plantar fascia and the calcaneus. There is some tenderness along the plantar aspect of the foot on the medial arch. There does appear to be some subjective numbness overlying the distal leg.   Incision on the dorsal aspect of the midfoot from  exostectomy is well-healed and there is no tenderness upon area.There is mild edema to the foot however does appear to be improved. No other areas of tenderness to bilateral lower extremities. No other open lesions or pre-ulcerative lesions.  No pain with calf compression, swelling, warmth, erythema.   Assessment and Plan:  Status post left foot surgery, with improving symptoms although discontinued have numbness and tingling to the leg after nerve  block.  -Treatment options discussed including all alternatives, risks, and complications -Anesthesia to call the patient and discussed that numbness after the nerve block. We'll continue to monitor. -Continue with physical therapy. -She started transition to regular shoe as tolerated. I do believe that should benefit from orthotics. She has not been wearing them. She was scanned for orthotics today and they were sent to [facility] labs. -Ice and elevation -Anti-inflammatories. -Follow-up in 4 weeks or sooner if any problems arise. In the meantime, encouraged to call the office with any questions, concerns, change in symptoms.   Ovid Curd, DPM

## 2015-11-13 ENCOUNTER — Telehealth: Payer: Self-pay | Admitting: *Deleted

## 2015-11-13 ENCOUNTER — Encounter: Payer: Self-pay | Admitting: *Deleted

## 2015-11-13 NOTE — Telephone Encounter (Signed)
Pt states needs a note because she was unable to work yesterday due to foot pain. I spoke with pt and she states that she was out 1/2 day Monday, all day Tuesday and Thursday.  Dr. Bary Castilla the note. Pt is transferred to schedulers to see if she can be scheduled earlier than 11/20/2015 with Dr. Ardelle Anton.

## 2015-11-20 ENCOUNTER — Ambulatory Visit (INDEPENDENT_AMBULATORY_CARE_PROVIDER_SITE_OTHER): Payer: Federal, State, Local not specified - PPO | Admitting: Podiatry

## 2015-11-20 ENCOUNTER — Encounter: Payer: Self-pay | Admitting: Podiatry

## 2015-11-20 VITALS — BP 131/84 | HR 79 | Resp 18

## 2015-11-20 DIAGNOSIS — Z9889 Other specified postprocedural states: Secondary | ICD-10-CM

## 2015-11-20 DIAGNOSIS — M898X9 Other specified disorders of bone, unspecified site: Secondary | ICD-10-CM

## 2015-11-20 DIAGNOSIS — M722 Plantar fascial fibromatosis: Secondary | ICD-10-CM

## 2015-11-20 MED ORDER — METHYLPREDNISOLONE 4 MG PO TBPK: ORAL_TABLET | ORAL | Status: DC

## 2015-11-26 NOTE — Progress Notes (Signed)
Patient ID: Daisy [name], female   DOB: 12/01/1960, 55 y.o.   MRN: [id number]  Subjective: Daisy [name] is a 55 y.o. is seen today in office s/p left plantar fasciotomy and heel spur resection as well as dorsal exostectomy preformed on 07/01/15. She states that overall she feels that she is making steady improvement. She is continuing to a regular shoe and continue with rehabilitation exercises. She also presents a pickup orthotics.Denies any systemic complaints such as fevers, chills, nausea, vomiting. No calf pain, chest pain, shortness of breath.   Objective: General: No acute distress, AAOx3  DP/PT pulses palpable 2/4, CRT < 3 sec to all digits.  Protective sensation intact. Motor function intact.  Left foot: Incision on the medial aspect of the foot appears to coapted at this time there is no evidence of dehiscence and a scar has formed. There is no drainage or pus expressed  No open sores. Tenderness over the incision does appear to be continuing to improve. There is also decreased tenderness of plantar aspect of the heel along the plantar fascia. There is decreased tenderness along the plantar aspect of the foot on the medial arch.  ncision on the dorsal aspect of the midfoot from  exostectomy is well-healed and there is no tenderness upon area.There is mild edema to the foot however does appear to be improved. No other areas of tenderness to bilateral lower extremities. No other open lesions or pre-ulcerative lesions.  No pain with calf compression, swelling, warmth, erythema.   Assessment and Plan:  Status post left foot surgery, with improving symptoms  -Treatment options discussed including all alternatives, risks, and complications -Continue with physical therapy. -Continue regular shoe gear. Dispensed orthotics today oral and break in instructions were discussed the patient. -Anti-inflammatories as needed -Ice and elevation -Follow-up in 4 weeks or sooner if any problems arise. In the  meantime, encouraged to call the office with any questions, concerns, change in symptoms.   Ovid Curd, DPM

## 2015-12-18 ENCOUNTER — Encounter: Payer: Self-pay | Admitting: Podiatry

## 2015-12-18 ENCOUNTER — Ambulatory Visit (INDEPENDENT_AMBULATORY_CARE_PROVIDER_SITE_OTHER): Payer: Federal, State, Local not specified - PPO | Admitting: Podiatry

## 2015-12-18 VITALS — BP 123/76 | HR 69 | Resp 12

## 2015-12-18 DIAGNOSIS — M722 Plantar fascial fibromatosis: Secondary | ICD-10-CM | POA: Diagnosis not present

## 2015-12-18 DIAGNOSIS — M792 Neuralgia and neuritis, unspecified: Secondary | ICD-10-CM

## 2015-12-20 NOTE — Progress Notes (Signed)
Patient ID: Daisy [name], female   DOB: 03/02/1961, 55 y.o.   MRN: [id number]  Subjective: Daisy [name] is a 55 y.o. is seen today in office s/p left plantar fasciotomy and heel spur resection as well as dorsal exostectomy preformed on 07/01/15. She feels that she is making some steady improvement compared to last appointment. She is still going to physical therapy and she doesn't this has been helping. She is wearing a regular shoe although she does get some pain to the bottom of her heel as well as numbness to her foot and leg. I will this may be from the nerve block and anesthesia has discussed this with her as well. She occasional feels that she gets some sharp shooting pain to her foot and some occasional numbness. No recent injury or trauma. Denies any systemic complaints such as fevers, chills, nausea, vomiting. No calf pain, chest pain, shortness of breath.   Objective: General: No acute distress, AAOx3  DP/PT pulses palpable 2/4, CRT < 3 sec to all digits.  Protective sensation intact. Motor function intact.  Left foot: Incision is well-healed the scar is formed. There is decreased edema on the area. There is continued tenderness palpation along the plantar heel along the surgical site. There is no erythema or increase in warmth. There is no drainage. There is no pain lateral compression of the calcaneus. There is no other area of tenderness to bilateral lower extremities. No other open lesions or pre-ulcerative lesions.  No pain with calf compression, swelling, warmth, erythema.   Assessment and Plan:  Status post left foot surgery, with slowly improving symptoms.  -Treatment options discussed including all alternatives, risks, and complications -Due to the continued pain and discussed steroid injection to the area. She wishes to proceed. Under sterile conditions a mixture of Kenalog and local anesthetic was infiltrated into the area of maximal tenderness along the surgical site the plantar  medial heel. She tolerated injection well any complications. Post injection care was discussed. -Continue stretching, icing, physical therapy. -Given the nerve symptoms I discussed starting treatment for neuropathy/nerve symptoms. She was talking or therapy. We'll start with a compound cream. This is ordered today. -Follow-up in 4 weeks or sooner if any problems arise. In the meantime, encouraged to call the office with any questions, concerns, change in symptoms.   Ovid CurdMatthew Katena Petitjean, DPM

## 2016-01-13 ENCOUNTER — Encounter: Payer: Self-pay | Admitting: Podiatry

## 2016-01-13 ENCOUNTER — Encounter: Payer: Self-pay | Admitting: *Deleted

## 2016-01-13 ENCOUNTER — Ambulatory Visit (INDEPENDENT_AMBULATORY_CARE_PROVIDER_SITE_OTHER): Payer: Federal, State, Local not specified - PPO | Admitting: Podiatry

## 2016-01-13 VITALS — BP 128/83 | HR 89 | Resp 18

## 2016-01-13 DIAGNOSIS — M779 Enthesopathy, unspecified: Secondary | ICD-10-CM

## 2016-01-13 DIAGNOSIS — M25572 Pain in left ankle and joints of left foot: Secondary | ICD-10-CM | POA: Diagnosis not present

## 2016-01-15 ENCOUNTER — Ambulatory Visit: Payer: Federal, State, Local not specified - PPO | Admitting: Podiatry

## 2016-01-17 NOTE — Progress Notes (Signed)
Patient ID: Daisy GoldsVeda [name], female   DOB: 10/13/1960, 55 y.o.   MRN: [id number]  Subjective: 55 year old female presents the office today after she fell off a curb yesterday and she's had pain to the inside part of her left ankle since then. She has noticed some swelling to the left ankle. She has difficulty with walking for long periods or standing. No redness to the area. No other injury. Prior to this she states the heel was doing much better.Denies any systemic complaints such as fevers, chills, nausea, vomiting. No acute changes since last appointment, and no other complaints at this time.   Objective: AAO x3, NAD DP/PT pulses palpable bilaterally, CRT less than 3 seconds Protective sensation intact with Simms Weinstein monofilament There is localized edema as well as tenderness on the medial aspect left ankle just posterior to the medial malleolus along the course of the flexor tendons. Flexor tendons appear to be intact. Range of motion intact to the ankle, subtalar, midtarsal, MPJ. MMT 5/5. There is decreased tenderness palpation of the plantar medial tubercle of the calcaneus at the insertion of the plantar fascia. No pain on the course of plantar fascial in the arch of the foot. There is no area pinpoint bony tenderness or pain the vibratory sensation. There is no pain on the medial or lateral ankle ligaments. No edema, erythema, increase in warmth to bilateral lower extremities.  No open lesions or pre-ulcerative lesions.  No pain with calf compression, swelling, warmth, erythema  Assessment: 55 year old female with acute ankle sprain/tendinitis  Plan: -All treatment options discussed with the patient including all alternatives, risks, complications.  -X-rays were ordered -Tendons appear to be intact, doubtful full tear. Placed back in CAM boot. Ice and elevation. Pain medication as needed which was prescribed today. -At next appointment symptoms continue or not improved we'll likely  obtain an MRI. -Patient encouraged to call the office with any questions, concerns, change in symptoms.   Ovid CurdMatthew Wagoner, DPM

## 2016-01-18 ENCOUNTER — Ambulatory Visit (HOSPITAL_BASED_OUTPATIENT_CLINIC_OR_DEPARTMENT_OTHER)
Admission: RE | Admit: 2016-01-18 | Discharge: 2016-01-18 | Disposition: A | Discharge status: Home / Self Care | Payer: Federal, State, Local not specified - PPO | Source: Ambulatory Visit | Attending: Podiatry | Admitting: Podiatry

## 2016-01-18 DIAGNOSIS — M25572 Pain in left ankle and joints of left foot: Secondary | ICD-10-CM

## 2016-01-20 ENCOUNTER — Encounter: Payer: Self-pay | Admitting: Podiatry

## 2016-01-20 ENCOUNTER — Ambulatory Visit (INDEPENDENT_AMBULATORY_CARE_PROVIDER_SITE_OTHER): Payer: Federal, State, Local not specified - PPO | Admitting: Podiatry

## 2016-01-20 VITALS — BP 138/91 | HR 72 | Resp 18

## 2016-01-20 DIAGNOSIS — S96912D Strain of unspecified muscle and tendon at ankle and foot level, left foot, subsequent encounter: Secondary | ICD-10-CM | POA: Diagnosis not present

## 2016-01-20 DIAGNOSIS — M779 Enthesopathy, unspecified: Secondary | ICD-10-CM | POA: Diagnosis not present

## 2016-01-20 DIAGNOSIS — M25572 Pain in left ankle and joints of left foot: Secondary | ICD-10-CM

## 2016-01-20 MED ORDER — METHYLPREDNISOLONE 4 MG PO TBPK: ORAL_TABLET | ORAL | Status: DC

## 2016-01-21 ENCOUNTER — Telehealth: Payer: Self-pay | Admitting: *Deleted

## 2016-01-21 DIAGNOSIS — M25572 Pain in left ankle and joints of left foot: Secondary | ICD-10-CM

## 2016-01-21 DIAGNOSIS — M779 Enthesopathy, unspecified: Secondary | ICD-10-CM

## 2016-01-21 NOTE — Telephone Encounter (Addendum)
-----   Message from Vivi BarrackMatthew R Wagoner, DPM sent at 01/20/2016  4:34 PM EDT ----- Can you order an MRI of the left ankle due to pain s/p inversion injury and continued pain along the medial ankle? Thanks. [facility] is good for her for the MRI.  01/22/2016-D. [facility] [place] states prior authorization is not needed [employer], reference# [id number].  Faxed.  01/25/2016-Pt states Dr. Ardelle AntonWagoner forgot to give her Oxycodone rx at the last visit.  I told pt I would asked Dr. Ardelle AntonWagoner about a refill and call her again.  01/26/2016-Left message [phone], that Dr. Ardelle AntonWagoner had reviewed her MRI and instructed she could begin to return to wearing a regular shoe, to continue the steroid dose pack to completion and ice the area for discomfort, there is still some areas of inflammation.  01/28/2016-Pt asked if her pain medication was ready.  I informed pt that I left message for her 01/26/2016 to pick up the medication. 02/01/2016-Jennifer - [facility] states the rx of 01/26/2016 was written for Oxycodone and has to be rewritten to say Percocet by [place] law. Percocet rx written and [facility] will call for pt to pick up in office.  02/08/2016-Pt request MRI results and wanted to know when she could begin PT again.  02/09/2016-Informed pt that Dr. Ardelle AntonWagoner said she could begin PT again, gradually going back in the athletic shoe. 04/29/2016-Pt states she asked for a refill of her medication at he last appt. I tried to contact pt to see what medication she wanted to refill, her phone was busy. 05/02/2016-I spoke with pt and she states shewould like a refill of the Oxycodone and Phenergan. Dr. Bary CastillaWagoner okayed refill of Percocet and Phenergan as previously. Left message to pickup the rx in the [place] office.

## 2016-01-22 NOTE — Progress Notes (Signed)
Patient ID: Daisy GoldsVeda [name], female   DOB: 07/09/1961, 55 y.o.   MRN: [id number]  Subjective:  55 year old female presents the office they for Evaluation of left ankle pain. She states that after she had the injury which she will her ankle she's had continued pain. Inside aspect. The swelling has improved her pain is continued. She'll remain in the cam boot. She has been taking anti-inflammatories and icing area. No numbness or tingling. The pain does not wake her up at night.  She's had a prior injury her heel was doing better. Denies any systemic complaints such as fevers, chills, nausea, vomiting. No acute changes since last appointment, and no other complaints at this time.   Objective: AAO x3, NAD DP/PT pulses palpable bilaterally, CRT less than 3 seconds Protective sensation intact with Simms Weinstein monofilament Scar from prior surgeries well-healed. There is mild to palpation the plantar aspect of the heel and surgical site however this has improved. Enjoys symptoms appear to be continuation on the posterior aspect of the medial malleolus the left ankle. Edema has improved having pain has continued. There is no erythema or increase in warmth. This and they're to be intact. No areas of pinpoint bony tenderness or pain with vibratory sensation. MMT 5/5, ROM WNL. No edema, erythema, increase in warmth to bilateral lower extremities.  No open lesions or pre-ulcerative lesions.  No pain with calf compression, swelling, warmth, erythema  Assessment: Continuation left medial ankle pain, tendinitis versus partial tear  Plan: -All treatment options discussed with the patient including all alternatives, risks, complications.  -Prescribed Medrol Dosepak. Continue icing. Once the steroids complaints she can restart anti-inflammatories. -At this point she has been immobilized and she continues to have pain. Concerned about a possible partial tear. Because of this recommend an MRI which is  ordered. -Follow-up after MRI -Patient encouraged to call the office with any questions, concerns, change in symptoms.   Ovid CurdMatthew Wagoner, DPM

## 2016-01-23 ENCOUNTER — Ambulatory Visit (HOSPITAL_BASED_OUTPATIENT_CLINIC_OR_DEPARTMENT_OTHER)
Admission: RE | Admit: 2016-01-23 | Discharge: 2016-01-23 | Disposition: A | Discharge status: Home / Self Care | Payer: Federal, State, Local not specified - PPO | Source: Ambulatory Visit | Attending: Podiatry | Admitting: Podiatry

## 2016-01-23 DIAGNOSIS — M722 Plantar fascial fibromatosis: Secondary | ICD-10-CM | POA: Insufficient documentation

## 2016-01-23 DIAGNOSIS — M25572 Pain in left ankle and joints of left foot: Secondary | ICD-10-CM

## 2016-01-23 DIAGNOSIS — M779 Enthesopathy, unspecified: Secondary | ICD-10-CM | POA: Diagnosis not present

## 2016-01-25 ENCOUNTER — Ambulatory Visit: Payer: Federal, State, Local not specified - PPO | Admitting: Podiatry

## 2016-01-25 NOTE — Telephone Encounter (Signed)
OK to refill

## 2016-01-26 MED ORDER — OXYCODONE-ACETAMINOPHEN 5-325 MG PO TABS: 1.0000 | ORAL_TABLET | Freq: Four times a day (QID) | ORAL | Status: DC | PRN

## 2016-01-28 ENCOUNTER — Other Ambulatory Visit (HOSPITAL_BASED_OUTPATIENT_CLINIC_OR_DEPARTMENT_OTHER): Payer: Self-pay | Admitting: Family Medicine

## 2016-01-28 DIAGNOSIS — Z1231 Encounter for screening mammogram for malignant neoplasm of breast: Secondary | ICD-10-CM

## 2016-02-09 NOTE — Telephone Encounter (Signed)
She can go ahead and restart PT

## 2016-02-26 DIAGNOSIS — M779 Enthesopathy, unspecified: Secondary | ICD-10-CM

## 2016-03-03 ENCOUNTER — Ambulatory Visit (HOSPITAL_BASED_OUTPATIENT_CLINIC_OR_DEPARTMENT_OTHER)
Admission: RE | Admit: 2016-03-03 | Discharge: 2016-03-03 | Disposition: A | Discharge status: Home / Self Care | Payer: Federal, State, Local not specified - PPO | Source: Ambulatory Visit | Attending: Family Medicine | Admitting: Family Medicine

## 2016-03-03 DIAGNOSIS — Z1231 Encounter for screening mammogram for malignant neoplasm of breast: Secondary | ICD-10-CM | POA: Diagnosis present

## 2016-04-27 ENCOUNTER — Ambulatory Visit (INDEPENDENT_AMBULATORY_CARE_PROVIDER_SITE_OTHER): Payer: Federal, State, Local not specified - PPO | Admitting: Podiatry

## 2016-04-27 ENCOUNTER — Encounter: Payer: Self-pay | Admitting: Podiatry

## 2016-04-27 DIAGNOSIS — M79672 Pain in left foot: Secondary | ICD-10-CM

## 2016-04-27 DIAGNOSIS — M722 Plantar fascial fibromatosis: Secondary | ICD-10-CM | POA: Diagnosis not present

## 2016-05-02 MED ORDER — PROMETHAZINE HCL 25 MG PO TABS: 25.0000 mg | ORAL_TABLET | Freq: Four times a day (QID) | ORAL | 0 refills | Status: AC | PRN

## 2016-05-02 MED ORDER — OXYCODONE-ACETAMINOPHEN 5-325 MG PO TABS: 1.0000 | ORAL_TABLET | Freq: Three times a day (TID) | ORAL | 0 refills | Status: DC | PRN

## 2016-05-02 NOTE — Telephone Encounter (Signed)
That is fine to do. I was going to do last week but I was told she would just get it at the next appointment. But she can get it before if she wants to come pick it up

## 2016-05-09 NOTE — Progress Notes (Signed)
Patient ID: Daisy GoldsVeda [name], female   DOB: 06/21/1961, 55 y.o.   MRN: [id number]  Subjective: 55 year old female presents the office today for continued pain in the arch of the foot. She states that she gets some schedule swelling along the incision site as well. The actual heel pain is doing much better but she is still getting the pain within the arch of the foot. She discontinued physical therapy as well. She presents today for follow-up evaluation. Denies any systemic complaints such as fevers, chills, nausea, vomiting. No acute changes since last appointment, and no other complaints at this time.   Objective: AAO x3, NAD DP/PT pulses palpable bilaterally, CRT less than 3 seconds Protective sensation intact with Simms Weinstein monofilament There is mild edema along the medial aspect of the heel on these incision sites however there is no erythema or increase in warmth. No fluctuance or crepitus. There is very minimal tenderness palpation on the plantar medial tubercle of the calcaneus at insertion of plantar fascia. The majority of her pain appears to be localized along the medial band of the plantar fascia within the arch of the foot. There is no area pinpoint bony tenderness or pain the vibratory sensation. Mild equinus is present. No other areas of tenderness bilaterally. No edema, erythema, increase in warmth to bilateral lower extremities.  No open lesions or pre-ulcerative lesions.  No pain with calf compression, swelling, warmth, erythema  Assessment: 55 year old female with arch pain, tendinitis  Plan: -All treatment options discussed with the patient including all alternatives, risks, complications.  -At this time discussed steroid injection around the incision due to the swelling. She wishes to proceed with this. Under sterile conditions a mixture of Kenalog and local anesthetic was infiltrated without Complications. Post injection care was discussed. -At this time a discussed with her  ESWT/EPAT for the tendinitis, arch pain. She wishes to proceed with this. I will [place] back in [place] next week to start this treatment.  Ovid CurdMatthew Ryman Rathgeber, DPM

## 2016-06-28 ENCOUNTER — Telehealth: Payer: Self-pay | Admitting: *Deleted

## 2016-06-28 ENCOUNTER — Ambulatory Visit (HOSPITAL_BASED_OUTPATIENT_CLINIC_OR_DEPARTMENT_OTHER): Payer: Federal, State, Local not specified - PPO

## 2016-06-28 ENCOUNTER — Encounter: Payer: Self-pay | Admitting: Podiatry

## 2016-06-28 ENCOUNTER — Ambulatory Visit (INDEPENDENT_AMBULATORY_CARE_PROVIDER_SITE_OTHER): Payer: Federal, State, Local not specified - PPO | Admitting: Podiatry

## 2016-06-28 DIAGNOSIS — M722 Plantar fascial fibromatosis: Secondary | ICD-10-CM

## 2016-06-28 DIAGNOSIS — M6740 Ganglion, unspecified site: Secondary | ICD-10-CM | POA: Diagnosis not present

## 2016-06-28 DIAGNOSIS — R52 Pain, unspecified: Secondary | ICD-10-CM | POA: Diagnosis not present

## 2016-06-28 DIAGNOSIS — M779 Enthesopathy, unspecified: Secondary | ICD-10-CM

## 2016-06-28 DIAGNOSIS — M79672 Pain in left foot: Secondary | ICD-10-CM

## 2016-06-28 NOTE — Progress Notes (Signed)
Subjective: 55 year old female presents the office today for concerns of swelling and some discomfort the top of her left foot on the surgical site. She's been undergoing physical therapy which is been helping quite a bit to her foot. However despite physical therapy she is on and some swelling to the top of her foot. This has been ongoing since the surgery and has not worsened or gotten better. Denies any systemic complaints such as fevers, chills, nausea, vomiting. No acute changes since last appointment, and no other complaints at this time.   Objective: AAO x3, NAD DP/PT pulses palpable bilaterally, CRT less than 3 seconds The dorsal aspect left midfoot on the incision is some trace edema to the area. Small fluid-filled cyst is palpable under the area however there is no erythema or increase in warmth. There is no fluctuance or crepitus. There is minimal discomfort to the area. This greatly improved tenderness on the arch of the foot and along the heel compared to previous appointment. She is still undergoing physical therapy. No area pinpoint bony tenderness. No edema, erythema, increase in warmth to bilateral lower extremities.  No open lesions or pre-ulcerative lesions.  No pain with calf compression, swelling, warmth, erythema  Assessment: Left foot residual/reoccurance of ganglion cyst, capsulitis dorsal midfoot  Plan: -All treatment options discussed with the patient including all alternatives, risks, complications.  -Discussed a steroid injection into the air and she wishes to proceed. Sterile conditions and she of Kenalog, dexamethasone, local anesthetic was infiltrated without complications. Post injection care was discussed. -Continue physical therapy. -She actually gave away her insert. We reordered orthotics for her today. -Follow up in 4 weeks or sooner if needed. Call any questions or concerns meantime. -Patient encouraged to call the office with any questions, concerns, change  in symptoms.   Ovid CurdMatthew Jabri Blancett, DPM

## 2016-06-28 NOTE — Telephone Encounter (Signed)
Per Dr Andi DevonWagoner-patient is getting a second pair for $199.00. [name]

## 2016-07-26 ENCOUNTER — Ambulatory Visit (INDEPENDENT_AMBULATORY_CARE_PROVIDER_SITE_OTHER): Payer: Federal, State, Local not specified - PPO | Admitting: Podiatry

## 2016-07-26 ENCOUNTER — Encounter (INDEPENDENT_AMBULATORY_CARE_PROVIDER_SITE_OTHER): Payer: Self-pay

## 2016-07-26 ENCOUNTER — Encounter: Payer: Self-pay | Admitting: Podiatry

## 2016-07-26 DIAGNOSIS — M722 Plantar fascial fibromatosis: Secondary | ICD-10-CM

## 2016-07-26 DIAGNOSIS — M79672 Pain in left foot: Secondary | ICD-10-CM | POA: Diagnosis not present

## 2016-07-26 DIAGNOSIS — M7740 Metatarsalgia, unspecified foot: Secondary | ICD-10-CM

## 2016-07-26 DIAGNOSIS — M779 Enthesopathy, unspecified: Secondary | ICD-10-CM | POA: Diagnosis not present

## 2016-07-26 NOTE — Progress Notes (Signed)
Subjective: 55 year old female presents the office today to pick up orthotics. She states that she is still visible therapy this has been helping. She states that her swelling has improved. She get some tightness to her toes but overall she feels that she is improving. No recent injury or trauma. Denies any systemic complaints such as fevers, chills, nausea, vomiting. No acute changes since last appointment, and no other complaints at this time.   Objective: AAO x3, NAD DP/PT pulses palpable bilaterally, CRT less than 3 seconds All incision on the dorsal aspect of the foot the swelling appears been much improved and there is no tenderness to palpation in this area. There is still some mild tenderness on the medial aspect of the heel on the incision from the prior surgery however again this appears to be improved. There is mild tenderness to palpation of this area. There is mild discomfort submetatarsal area there is no specific area pinpoint tenderness or pain the vibratory sensation. No other areas of edema, erythema, increased warmth. No open lesions or pre-ulcerative lesions.  No pain with calf compression, swelling, warmth, erythema  Assessment: Improving pain left foot  Plan: -All treatment options discussed with the patient including all alternatives, risks, complications.  -Orthotics were dispensed today. Oral and written break in instructions were discussed. -Continue anti-inflammatory compound cream. She has not been using this. -Ice -Finish physical therapy -Follow-up in 4 weeks or sooner if any problems arise. In the meantime, encouraged to call the office with any questions, concerns, change in symptoms.   Ovid CurdMatthew Hadlyn Amero, DPM

## 2016-08-03 ENCOUNTER — Telehealth: Payer: Self-pay | Admitting: Podiatry

## 2016-08-03 ENCOUNTER — Ambulatory Visit (INDEPENDENT_AMBULATORY_CARE_PROVIDER_SITE_OTHER): Payer: Federal, State, Local not specified - PPO | Admitting: Podiatry

## 2016-08-03 ENCOUNTER — Encounter: Payer: Self-pay | Admitting: Podiatry

## 2016-08-03 DIAGNOSIS — M779 Enthesopathy, unspecified: Secondary | ICD-10-CM

## 2016-08-03 MED ORDER — TRIAMCINOLONE ACETONIDE 10 MG/ML IJ SUSP
10.0000 mg | Freq: Once | INTRAMUSCULAR | Status: AC
  Administered 2016-08-03: 10 mg

## 2016-08-03 NOTE — Telephone Encounter (Signed)
Late entry Pt called and was wanting to see Dr Ardelle AntonWagoner for an injection and had left a message on nurse schedule earlier in the day. She is having left heel pain and is going out of town tomorrow.   I offered pt an appt tomorrow to see another provider  and she said she could not because she had a dentist appt.   I spoke to Dr Ardelle AntonWagoner and he said to see if she could come in today and I called her back and scheduled her to see Dr Charlsie Merlesegal today.

## 2016-08-04 NOTE — Progress Notes (Signed)
Subjective:     Patient ID: Daisy [name], female   DOB: 03/10/1961, 55 y.o.   MRN: [id number]  HPI patient states she's going out of town and she's getting pain on top of her left foot and she wants medication to control it but overall she is starting to improve as far as her heel goes   Review of Systems     Objective:   Physical Exam Neurovascular status intact with patient's left arch and heel doing well at the current time with pain in the dorsum of the left foot around the incision site with mild edema medial and lateral to the incision site with a well coapted incision site    Assessment:     Possible dorsal tendinitis left with inflammation with improved fasciitis    Plan:     Injected the dorsal tendon complex 3 mg Kenalog 5 mill grams Xylocaine and instructed on heat ice and reappoint for regular visit

## 2016-08-19 ENCOUNTER — Encounter (HOSPITAL_BASED_OUTPATIENT_CLINIC_OR_DEPARTMENT_OTHER): Payer: Self-pay

## 2016-08-19 ENCOUNTER — Emergency Department (HOSPITAL_BASED_OUTPATIENT_CLINIC_OR_DEPARTMENT_OTHER): Payer: Federal, State, Local not specified - PPO

## 2016-08-19 ENCOUNTER — Emergency Department (HOSPITAL_BASED_OUTPATIENT_CLINIC_OR_DEPARTMENT_OTHER)
Admission: EM | Admit: 2016-08-19 | Discharge: 2016-08-19 | Disposition: A | Discharge status: Home / Self Care | Payer: Federal, State, Local not specified - PPO | Attending: Emergency Medicine | Admitting: Emergency Medicine

## 2016-08-19 DIAGNOSIS — J069 Acute upper respiratory infection, unspecified: Secondary | ICD-10-CM

## 2016-08-19 DIAGNOSIS — R05 Cough: Secondary | ICD-10-CM | POA: Diagnosis present

## 2016-08-19 DIAGNOSIS — R059 Cough, unspecified: Secondary | ICD-10-CM

## 2016-08-19 MED ORDER — BENZONATATE 100 MG PO CAPS: 100.0000 mg | ORAL_CAPSULE | Freq: Three times a day (TID) | ORAL | 0 refills | Status: DC

## 2016-08-19 MED ORDER — PROMETHAZINE HCL 6.25 MG/5ML PO SYRP: 6.2500 mg | ORAL_SOLUTION | Freq: Four times a day (QID) | ORAL | 0 refills | Status: DC | PRN

## 2016-08-19 MED ORDER — ALBUTEROL SULFATE (2.5 MG/3ML) 0.083% IN NEBU: 5.0000 mg | INHALATION_SOLUTION | Freq: Once | RESPIRATORY_TRACT | Status: DC

## 2016-08-19 NOTE — ED Provider Notes (Signed)
[facility] Note   CSN: [id number] Arrival date & time: 08/19/16  1821  By signing my name below, I, Freida Busman, attest that this documentation has been prepared under the direction and in the presence of Mattie Marlin, PA-C. Electronically Signed: Freida Busman, Scribe. 08/19/2016. 10:41 PM.  History   Chief Complaint Chief Complaint  Patient presents with  . Cough    The history is provided by the patient. No language interpreter was used.     HPI Comments:  Daisy [name] is a 55 y.o. female who presents to the Emergency Department complaining of a dry cough x 2 days. Pt reports associated congestion, sneezing, PND and chills. Pt has a h/o the same; last episode was this time last year. She states at that time she was placed on z-pack and promethazine with relief of her symptoms. No alleviating factors noted; no treatments tried PTA. She denies fever, sore throat, ear pain, abdominal pain, CP, or vision changes.   History reviewed. No pertinent past medical history.  Patient Active Problem List   Diagnosis Date Noted  . 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 Status post surgery 08/16/2015  . Plantar fasciitis of left foot 08/16/2015    Past Surgical History:  Procedure Laterality Date  . FOOT SURGERY    . KNEE SURGERY    . NM ESOPHAGEAL REFLUX      OB History    No data available       Home Medications    Prior to Admission medications   Medication Sig Start Date End Date Taking? Authorizing Provider  acetaminophen-codeine (TYLENOL #2) 300-15 MG per tablet Take by mouth.    Historical Provider, MD  benzonatate (TESSALON) 100 MG capsule Take 1 capsule (100 mg total) by mouth every 8 (eight) hours. 08/19/16   Jerre Simon, PA  cephALEXin (KEFLEX) 500 MG capsule Take 1 capsule (500 mg total) by mouth 3 (three) times daily. 08/14/15   Vivi Barrack, DPM  Cholecalciferol (VITAMIN D-1000 MAX ST) 1000 UNITS tablet Take by mouth.    Historical  Provider, MD  diclofenac (VOLTAREN) 75 MG EC tablet Take 1 tablet (75 mg total) by mouth 2 (two) times daily. 08/18/15   Vivi Barrack, DPM  ferrous sulfate 325 (65 FE) MG tablet Take by mouth.    Historical Provider, MD  losartan (COZAAR) 50 MG tablet Take by mouth.    Historical Provider, MD  methylPREDNISolone (MEDROL DOSEPAK) 4 MG TBPK tablet Take as directed 01/20/16   Vivi Barrack, DPM  NONFORMULARY OR COMPOUNDED ITEM [facility] compound:  Peripheral Neuropathy cream - Bupivacaine 1%, Doxepin 3%, Gabapentin 6%, Pentoxifylline 3%, Topiramate 1%, dispense 120 grams, apply 1-2 grams to affected area 3-4 times a day, +2refills. 10/08/15   Vivi Barrack, DPM  oxyCODONE-acetaminophen (ROXICET) 5-325 MG tablet Take 1-2 tablets by mouth every 6 (six) hours as needed for severe pain. 01/26/16   Vivi Barrack, DPM  oxyCODONE-acetaminophen (ROXICET) 5-325 MG tablet Take 1 tablet by mouth every 8 (eight) hours as needed for severe pain. 05/02/16   Vivi Barrack, DPM  promethazine (PHENERGAN) 25 MG tablet Take 1 tablet (25 mg total) by mouth every 6 (six) hours as needed for nausea or vomiting. 05/02/16   Vivi Barrack, DPM  promethazine (PHENERGAN) 6.25 MG/5ML syrup Take 5 mLs (6.25 mg total) by mouth every 6 (six) hours as needed for nausea or vomiting. 08/19/16   Jerre Simon, PA  vitamin E 400 UNIT capsule  Take by mouth.    Historical Provider, MD    Family History No family history on file.  Social History Social History  Substance Use Topics  . Smoking status: Never Smoker  . Smokeless tobacco: Never Used  . Alcohol use 0.0 oz/week     Comment: occ     Allergies   Sulfa antibiotics   Review of Systems Review of Systems  Constitutional: Positive for chills.  HENT: Positive for congestion and sneezing. Negative for ear pain, postnasal drip and sore throat.   Eyes: Negative for visual disturbance.  Respiratory: Positive for cough.   Cardiovascular: Negative  for chest pain.  Gastrointestinal: Negative for abdominal pain.     Physical Exam Updated Vital Signs BP 158/80   Pulse 75   Temp 98.4 F (36.9 C) (Oral)   Resp 16   Ht 5\' 8"  (1.727 m)   Wt 230 lb (104.3 kg)   SpO2 100%   BMI 34.97 kg/m   Physical Exam  Constitutional: She appears well-developed and well-nourished. No distress.  HENT:  Head: Normocephalic and atraumatic.  Nose: Mucosal edema and rhinorrhea present.  Mouth/Throat: Uvula is midline, oropharynx is clear and moist and mucous membranes are normal. No trismus in the jaw. No uvula swelling.  Eyes: Conjunctivae and lids are normal. Pupils are equal, round, and reactive to light.  Neck: Normal range of motion.  Cardiovascular: Normal rate, regular rhythm and normal heart sounds.  Exam reveals no gallop and no friction rub.   No murmur heard. Pulmonary/Chest: Effort normal and breath sounds normal. No respiratory distress. She has no wheezes. She has no rales.  Musculoskeletal: Normal range of motion.  Neurological: She is alert. Coordination normal.  Skin: Skin is warm and dry. She is not diaphoretic.  Psychiatric: She has a normal mood and affect. Her behavior is normal.  Nursing note and vitals reviewed.  ED Treatments / Results  DIAGNOSTIC STUDIES:  Oxygen Saturation is 100% on RA, normal by my interpretation.    COORDINATION OF CARE:  10:37 PM Discussed treatment plan with pt at bedside and pt agreed to plan.  Labs (all labs ordered are listed, but only abnormal results are displayed) Labs Reviewed - No data to display  EKG  EKG Interpretation None       Radiology Dg Chest 2 View  Result Date: 08/19/2016 CLINICAL DATA:  Cough EXAM: CHEST  2 VIEW COMPARISON:  Chest radiograph 10/28/2014 FINDINGS: Cardiomediastinal contours are normal. No pneumothorax or pleural effusion. No focal airspace consolidation or pulmonary edema. IMPRESSION: Clear lungs. Electronically Signed   By: Deatra RobinsonKevin  Herman M.D.    On: 08/19/2016 21:46    Procedures Procedures (including critical care time)  Medications Ordered in ED Medications - No data to display   Initial Impression / Assessment and Plan / ED Course  I have reviewed the triage vital signs and the nursing notes.  Pertinent labs & imaging results that were available during my care of the patient were reviewed by me and considered in my medical decision making (see chart for details).  Clinical Course    Pt symptoms consistent with URI. CXR negative for acute infiltrate. Pt will be discharged with symptomatic treatment which inclueds mucinex, afrin, zyrtec and cough syrup.  Instructed patient to follow-up with her PCP in 3-4 days if symptoms are not improving. Discussed return precautions.  Pt is hemodynamically stable & in NAD prior to discharge and expressed understanding to the discharge instructions.  Final Clinical Impressions(s) / ED Diagnoses  Final diagnoses:  Cough  Upper respiratory tract infection, unspecified type    New Prescriptions Discharge Medication List as of 08/19/2016 10:51 PM    START taking these medications   Details  benzonatate (TESSALON) 100 MG capsule Take 1 capsule (100 mg total) by mouth every 8 (eight) hours., Starting Fri 08/19/2016, Print    promethazine (PHENERGAN) 6.25 MG/5ML syrup Take 5 mLs (6.25 mg total) by mouth every 6 (six) hours as needed for nausea or vomiting., Starting Fri 08/19/2016, Print       I personally performed the services described in this documentation, which was scribed in my presence. The recorded information has been reviewed and is accurate.       Jerre SimonJessica L Atalie Oros, PA 08/19/16 2307    Laurence Spatesachel Morgan Little, MD 08/21/16 [phone]

## 2016-08-19 NOTE — ED Notes (Signed)
Family at bedside. 

## 2016-08-19 NOTE — Discharge Instructions (Signed)
Your x-ray was negative for any acute abnormalities. Take OTC Mucinex, Zyrtec and Afrin. Do not use Afrin for more than 3 days. Follow-up with your primary care provider in 3-4 days if symptoms worsen or are not improving. Return to emergency Department with any worsening symptoms or new concerning symptoms.

## 2016-08-19 NOTE — ED Triage Notes (Signed)
C/o cough x 2 days-NAD-steady gait

## 2016-08-23 ENCOUNTER — Ambulatory Visit: Payer: Federal, State, Local not specified - PPO | Admitting: Podiatry

## 2016-08-30 ENCOUNTER — Ambulatory Visit (HOSPITAL_BASED_OUTPATIENT_CLINIC_OR_DEPARTMENT_OTHER)
Admission: RE | Admit: 2016-08-30 | Discharge: 2016-08-30 | Disposition: A | Discharge status: Home / Self Care | Payer: Federal, State, Local not specified - PPO | Source: Ambulatory Visit | Attending: Podiatry | Admitting: Podiatry

## 2016-08-30 ENCOUNTER — Encounter: Payer: Self-pay | Admitting: Podiatry

## 2016-08-30 ENCOUNTER — Ambulatory Visit (INDEPENDENT_AMBULATORY_CARE_PROVIDER_SITE_OTHER): Payer: Federal, State, Local not specified - PPO | Admitting: Podiatry

## 2016-08-30 DIAGNOSIS — S92002A Unspecified fracture of left calcaneus, initial encounter for closed fracture: Secondary | ICD-10-CM | POA: Insufficient documentation

## 2016-08-30 DIAGNOSIS — Z09 Encounter for follow-up examination after completed treatment for conditions other than malignant neoplasm: Secondary | ICD-10-CM | POA: Diagnosis present

## 2016-08-30 DIAGNOSIS — X58XXXA Exposure to other specified factors, initial encounter: Secondary | ICD-10-CM | POA: Diagnosis not present

## 2016-08-30 DIAGNOSIS — Z9889 Other specified postprocedural states: Secondary | ICD-10-CM | POA: Insufficient documentation

## 2016-08-30 DIAGNOSIS — M19072 Primary osteoarthritis, left ankle and foot: Secondary | ICD-10-CM | POA: Insufficient documentation

## 2016-08-30 DIAGNOSIS — M7732 Calcaneal spur, left foot: Secondary | ICD-10-CM | POA: Diagnosis not present

## 2016-08-30 NOTE — Progress Notes (Signed)
Subjective: 55 year old female presents the office they for follow-up evaluation of left foot pain. She is continuing with physical therapy and doing well with this and she wishes to continue.She's of the heel is doing well but she still gets soreness the top of her foot. She has continue with physical therapy which is been doing well. Denies any numbness or tingling. The injection helped quite a bit. Her swelling is much improved. Denies any systemic complaints such as fevers, chills, nausea, vomiting. No acute changes since last appointment, and no other complaints at this time.   She feels that her orthotic arch is too high in the left foot.  Objective: AAO x3, NAD DP/PT pulses palpable bilaterally, CRT less than 3 seconds There is mild continue tennis on the midfoot on the left foot. There is mild serous on the incision from the prior surgery but there is also diffuse tenderness on both the medial and lateral aspects of this along the course of the Lisfranc joint. There is no specific area of bony tenderness. There is no snapping tenderness to palpation on the plantar medial tubercle of the calcaneus at the insertion upon her fascia along the arch of the foot as well. There is no pain with lateral compression of the calcaneus. There is no other areas of tenderness. No open lesions or pre-ulcerative lesions.  No pain with calf compression, swelling, warmth, erythema  Assessment: Continuation left midfoot pain Although improving  Plan: -All treatment options discussed with the patient including all alternatives, risks, complications.  -she requested a bit of improvement after the injection however would recommend holding off on doing this today. She agrees to this plan. Continued compound cream and she is to call the pharmacy for refill however if they are unable to get a refill to call us back and we will do anti-inflammatory compound cream through [facility]. -I have sent the orthotics back to  lower the arch and the left foot -Continue physical therapy -we'll obtain a new x-ray left foot to look at the joint as well. -She had questions about her FMLA and I completed a letter sent back to her insurance company for this. -follow-up once the insert arise or sooner if needed. -Patient encouraged to call the office with any questions, concerns, change in symptoms.   Ovid CurdMatthew Jacion Dismore, DPM

## 2016-09-29 ENCOUNTER — Telehealth: Payer: Self-pay | Admitting: *Deleted

## 2016-09-29 ENCOUNTER — Telehealth: Payer: Self-pay | Admitting: Podiatry

## 2016-09-29 NOTE — Telephone Encounter (Signed)
Called patient and left a message that I received the left insert that was sent back to be re-done and that I would be in the [place] office tomorrow if the patient wanted to come by and pick it up and to call the office if any concerns or questions. [name]

## 2016-09-29 NOTE — Telephone Encounter (Signed)
Ok thanks Daisy [name]. [name]

## 2016-09-29 NOTE — Telephone Encounter (Signed)
Pt returned call and is coming into [place] office tomorrow to pick up orthotic

## 2016-10-10 ENCOUNTER — Telehealth: Payer: Self-pay | Admitting: *Deleted

## 2016-10-10 MED ORDER — NONFORMULARY OR COMPOUNDED ITEM: 2 refills | Status: DC

## 2016-10-10 NOTE — Telephone Encounter (Signed)
Refill request for [facility] Neuropathy cream.  Dr. Ardelle AntonWagoner ordered refill +2. Return faxed.

## 2017-03-01 ENCOUNTER — Other Ambulatory Visit (HOSPITAL_BASED_OUTPATIENT_CLINIC_OR_DEPARTMENT_OTHER): Payer: Self-pay

## 2017-03-01 ENCOUNTER — Other Ambulatory Visit (HOSPITAL_BASED_OUTPATIENT_CLINIC_OR_DEPARTMENT_OTHER): Payer: Self-pay | Admitting: Obstetrics and Gynecology

## 2017-03-01 DIAGNOSIS — Z1231 Encounter for screening mammogram for malignant neoplasm of breast: Secondary | ICD-10-CM

## 2017-03-07 ENCOUNTER — Ambulatory Visit (HOSPITAL_BASED_OUTPATIENT_CLINIC_OR_DEPARTMENT_OTHER)
Admission: RE | Admit: 2017-03-07 | Discharge: 2017-03-07 | Disposition: A | Discharge status: Home / Self Care | Payer: Federal, State, Local not specified - PPO | Source: Ambulatory Visit | Attending: Obstetrics and Gynecology | Admitting: Obstetrics and Gynecology

## 2017-03-07 DIAGNOSIS — Z1231 Encounter for screening mammogram for malignant neoplasm of breast: Secondary | ICD-10-CM

## 2017-03-11 ENCOUNTER — Other Ambulatory Visit (HOSPITAL_BASED_OUTPATIENT_CLINIC_OR_DEPARTMENT_OTHER): Payer: Self-pay | Admitting: Specialist

## 2017-03-11 ENCOUNTER — Ambulatory Visit (HOSPITAL_BASED_OUTPATIENT_CLINIC_OR_DEPARTMENT_OTHER)
Admission: RE | Admit: 2017-03-11 | Discharge: 2017-03-11 | Disposition: A | Discharge status: Home / Self Care | Payer: Federal, State, Local not specified - PPO | Source: Ambulatory Visit | Attending: Specialist | Admitting: Specialist

## 2017-03-11 DIAGNOSIS — M5134 Other intervertebral disc degeneration, thoracic region: Secondary | ICD-10-CM | POA: Diagnosis not present

## 2017-03-11 DIAGNOSIS — M5442 Lumbago with sciatica, left side: Secondary | ICD-10-CM

## 2017-03-11 DIAGNOSIS — G8929 Other chronic pain: Secondary | ICD-10-CM

## 2017-03-11 DIAGNOSIS — M5416 Radiculopathy, lumbar region: Secondary | ICD-10-CM | POA: Diagnosis present

## 2018-02-16 ENCOUNTER — Other Ambulatory Visit (HOSPITAL_BASED_OUTPATIENT_CLINIC_OR_DEPARTMENT_OTHER): Payer: Self-pay | Admitting: Family Medicine

## 2018-02-16 DIAGNOSIS — Z1231 Encounter for screening mammogram for malignant neoplasm of breast: Secondary | ICD-10-CM

## 2018-03-08 ENCOUNTER — Ambulatory Visit (HOSPITAL_BASED_OUTPATIENT_CLINIC_OR_DEPARTMENT_OTHER)
Admission: RE | Admit: 2018-03-08 | Discharge: 2018-03-08 | Disposition: A | Discharge status: Home / Self Care | Payer: Federal, State, Local not specified - PPO | Source: Ambulatory Visit | Attending: Family Medicine | Admitting: Family Medicine

## 2018-03-08 DIAGNOSIS — Z1231 Encounter for screening mammogram for malignant neoplasm of breast: Secondary | ICD-10-CM | POA: Insufficient documentation

## 2018-03-28 ENCOUNTER — Encounter: Payer: Self-pay | Admitting: Obstetrics & Gynecology

## 2018-03-28 ENCOUNTER — Ambulatory Visit (INDEPENDENT_AMBULATORY_CARE_PROVIDER_SITE_OTHER): Payer: Federal, State, Local not specified - PPO | Admitting: Obstetrics & Gynecology

## 2018-03-28 VITALS — BP 133/79 | HR 81 | Resp 16 | Wt 233.0 lb

## 2018-03-28 DIAGNOSIS — R87810 Cervical high risk human papillomavirus (HPV) DNA test positive: Secondary | ICD-10-CM

## 2018-03-28 NOTE — Progress Notes (Signed)
Subjective:     Daisy [name] is a 57 y.o. female here for a routine exam. LMP 2017 [id number] SAB x2 Current complaints: no GYN problems.   Pt was prev followed by Dr. Ladene ArtistBarabara Eisenberg and Dr. Dewayne Shorteraren Wright. Colonoscopy 2015. Pt had a prev +hrHPV here for f/u for treatment plan and to establish care.   Pts mother had breast cancer at age 260. Pts sister had cervical cancer at age 55.      Gynecologic History No LMP recorded. Patient is postmenopausal. Contraception: none Last Pap: 07/27/2017. Results were: normal; neg hrHPV Last mammogram: 03/08/2018. Results were: normal  The following portions of the patient's history were reviewed and updated as appropriate: allergies, current medications, past family history, past medical history, past social history, past surgical history and problem list.  Review of Systems Pertinent items are noted in HPI.    Objective:  BP 133/79 (BP Location: Left Arm, Patient Position: Sitting, Cuff Size: Large)   Pulse 81   Resp 16   Wt 233 lb (105.7 kg)   BMI 35.43 kg/m   CONSTITUTIONAL: Well-developed, well-nourished female in no acute distress.  HENT:  Normocephalic, atraumatic EYES: Conjunctivae and EOM are normal. No scleral icterus.  NECK: Normal range of motion SKIN: Skin is warm and dry. No rash noted. Not diaphoretic.No pallor. NEUROLGIC: Alert and oriented to person, place, and time. Normal coordination.    05/12/2016 Authorizing Provider:Barbara Merri BrunetteFaith Eisenberg, Collected: 05/12/2016 0918  MD  [facility] Received:05/13/2016 0854  [facility] [place] First Screen:Sandra E McCaskill  Pathologist: Tilda FrancoJason Terrence  Fancey, MD Specimen:Liquid-Based Pap Smear, Vaginal/Cervical/Endocervical    Clinical history: [facility] [address] [place] [id number]  Thin prep Source cervical/endocervical   Interpretation Negative for intraepithelial lesion or malignancy Electronically signed by Tilda FrancoJason Terrence Fancey, MD on 05/17/2016 at2:26 PM   Specimen Adequacy Satisfactory for evaluation, endocervical/transformation zone component present   LMP 2/2,017   HPV Report, Addendum HOLOGIC APTIMA HPV ASSAY TEST RESULTS:  CATEGORYHPV TYPES PATIENT RESULTS  High Risk 16,18,31,33,35,39,45,51,52,56,58,59,66,68 DETECTED.  Reported by [facility] [place] on08/21/2017. See attached report for full explanation, interpretation, and limitations  of test results.   LAB AP PAP EDUCATIONAL NOTE The PAP smear is a screening test used as an aid in detecting cervical  cancer and its precursors. Published data indicates that PAP smear testing  is subject to both false negative and false positive results. It is NOT a  diagnostic procedure and should not be used as the sole means of detecting  cervical cancer. Periodic repeat testing and follow-up of any unexplained  clinical signs and symptoms are recommended.    07/26/2017 Accession Number:[id number]  Received:07/27/2017  Ordering Physician:DARREN Meredeth IdeSCOTT WRIGHT , MD  Patient Name:Daisy [name], Daisy [name]    CYTOLOGY REPORT      Final Cytologic Interpretation    LIQUID-BASED PAP CERVICAL WITH HPV:    Negative for intraepithelial lesion or malignancy.  Reactive/regenerative changes.    Specimen Adequacy:Satisfactory for evaluation;  endocervical/transformation zone component present.          I have personally reviewed the slides and/or other related  materials referenced, and have  edited the report as part of my  pathologic assessment and final interpretation.  Electronically Signed Out By:C.M. CROITORU, M.D. 08/01/2017  17:01:12            The Pap test is a screening test used as an aid in detecting  cervical cancer and its precursors. Published data  indicates that  Pap testing inherently involves both false-negative and  false-positive results. Pap tests are NOT diagnostic procedures  and should not be used exclusively for the detection of cervical  cancer. Periodic repeat testing with clinical correlation and  follow-up of unexplained clinical signs and symptoms is  recommended.    Specimen(s) Received  Liquid-based pap cervical with HPV    Clinical History  Date of Last Menstrual Period:menopausal  Menstrual History: Post-menopausal  Other Clinical Conditions: Routine GYN Exam  No Prior Dysplasia/Neoplasia  GC/Chlamydia not ordered  cervix/endocervix                Medical Director:  Ann B. 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    Plan:    Follow up in: 4 months.    repeat PAP with hrHPV at next PAP Annual mammogram Reviewed PAP indications and HPV with pt. All questions answered  Total face-to-face time with patient was 15 min.  Greater than 50% was spent in counseling and coordination of care with the patient.   Elecia Serafin L.  Harraway-Smith, M.D., [place]

## 2019-02-14 ENCOUNTER — Other Ambulatory Visit (HOSPITAL_BASED_OUTPATIENT_CLINIC_OR_DEPARTMENT_OTHER): Payer: Self-pay | Admitting: Family Medicine

## 2019-02-14 DIAGNOSIS — Z1231 Encounter for screening mammogram for malignant neoplasm of breast: Secondary | ICD-10-CM

## 2019-03-13 ENCOUNTER — Ambulatory Visit (HOSPITAL_BASED_OUTPATIENT_CLINIC_OR_DEPARTMENT_OTHER)
Admission: RE | Admit: 2019-03-13 | Discharge: 2019-03-13 | Disposition: A | Discharge status: Home / Self Care | Payer: Federal, State, Local not specified - PPO | Source: Ambulatory Visit | Attending: Family Medicine | Admitting: Family Medicine

## 2019-03-13 ENCOUNTER — Other Ambulatory Visit: Payer: Self-pay

## 2019-03-13 DIAGNOSIS — Z1231 Encounter for screening mammogram for malignant neoplasm of breast: Secondary | ICD-10-CM | POA: Diagnosis present

## 2020-01-20 ENCOUNTER — Other Ambulatory Visit (HOSPITAL_BASED_OUTPATIENT_CLINIC_OR_DEPARTMENT_OTHER): Payer: Self-pay | Admitting: Family Medicine

## 2020-01-20 DIAGNOSIS — Z1231 Encounter for screening mammogram for malignant neoplasm of breast: Secondary | ICD-10-CM

## 2020-03-16 ENCOUNTER — Ambulatory Visit (HOSPITAL_BASED_OUTPATIENT_CLINIC_OR_DEPARTMENT_OTHER)
Admission: RE | Admit: 2020-03-16 | Discharge: 2020-03-16 | Disposition: A | Discharge status: Home / Self Care | Payer: Federal, State, Local not specified - PPO | Source: Ambulatory Visit | Attending: Family Medicine | Admitting: Family Medicine

## 2020-03-16 ENCOUNTER — Other Ambulatory Visit: Payer: Self-pay

## 2020-03-16 DIAGNOSIS — Z1231 Encounter for screening mammogram for malignant neoplasm of breast: Secondary | ICD-10-CM | POA: Diagnosis present

## 2020-04-10 ENCOUNTER — Telehealth: Payer: Self-pay | Admitting: Podiatry

## 2020-04-10 NOTE — Telephone Encounter (Signed)
Pt left message @ 813am today asking for a call back about the sandals that have the arch support in the if we still sell them. She stated she has left several messages.  I returned call and she was talking about the vionic sandals and I told her we do not carry them anymore but to check [place] or online. This was the only message I have received.

## 2021-02-15 ENCOUNTER — Other Ambulatory Visit (HOSPITAL_BASED_OUTPATIENT_CLINIC_OR_DEPARTMENT_OTHER): Payer: Self-pay | Admitting: Family Medicine

## 2021-02-15 DIAGNOSIS — Z1231 Encounter for screening mammogram for malignant neoplasm of breast: Secondary | ICD-10-CM

## 2021-03-23 ENCOUNTER — Ambulatory Visit (HOSPITAL_BASED_OUTPATIENT_CLINIC_OR_DEPARTMENT_OTHER): Payer: Federal, State, Local not specified - PPO

## 2021-03-25 ENCOUNTER — Other Ambulatory Visit (HOSPITAL_BASED_OUTPATIENT_CLINIC_OR_DEPARTMENT_OTHER): Payer: Self-pay | Admitting: Podiatry

## 2021-03-25 DIAGNOSIS — S93412S Sprain of calcaneofibular ligament of left ankle, sequela: Secondary | ICD-10-CM

## 2021-03-30 ENCOUNTER — Inpatient Hospital Stay (HOSPITAL_BASED_OUTPATIENT_CLINIC_OR_DEPARTMENT_OTHER): Admission: RE | Admit: 2021-03-30 | Payer: Federal, State, Local not specified - PPO | Source: Ambulatory Visit

## 2021-04-03 ENCOUNTER — Ambulatory Visit (HOSPITAL_BASED_OUTPATIENT_CLINIC_OR_DEPARTMENT_OTHER): Payer: Federal, State, Local not specified - PPO

## 2021-04-05 ENCOUNTER — Other Ambulatory Visit: Payer: Self-pay

## 2021-04-05 ENCOUNTER — Ambulatory Visit (HOSPITAL_BASED_OUTPATIENT_CLINIC_OR_DEPARTMENT_OTHER)
Admission: RE | Admit: 2021-04-05 | Discharge: 2021-04-05 | Disposition: A | Discharge status: Home / Self Care | Payer: Medicare Other | Source: Ambulatory Visit | Attending: Family Medicine | Admitting: Family Medicine

## 2021-04-05 ENCOUNTER — Encounter (HOSPITAL_BASED_OUTPATIENT_CLINIC_OR_DEPARTMENT_OTHER): Payer: Self-pay

## 2021-04-05 DIAGNOSIS — Z1231 Encounter for screening mammogram for malignant neoplasm of breast: Secondary | ICD-10-CM | POA: Diagnosis present

## 2021-05-18 ENCOUNTER — Other Ambulatory Visit (HOSPITAL_BASED_OUTPATIENT_CLINIC_OR_DEPARTMENT_OTHER): Payer: Self-pay | Admitting: Podiatry

## 2021-05-18 DIAGNOSIS — M25373 Other instability, unspecified ankle: Secondary | ICD-10-CM

## 2021-05-22 ENCOUNTER — Other Ambulatory Visit: Payer: Self-pay

## 2021-05-22 ENCOUNTER — Ambulatory Visit (HOSPITAL_BASED_OUTPATIENT_CLINIC_OR_DEPARTMENT_OTHER)
Admission: RE | Admit: 2021-05-22 | Discharge: 2021-05-22 | Disposition: A | Discharge status: Home / Self Care | Payer: Medicare Other | Source: Ambulatory Visit | Attending: Podiatry | Admitting: Podiatry

## 2021-05-22 DIAGNOSIS — M25373 Other instability, unspecified ankle: Secondary | ICD-10-CM

## 2023-02-24 IMAGING — MR MR ANKLE*L* W/O CM
4 of 5 series · 17 of 40 positions shown · non-contrast
Comparison: Left foot radiograph 08/30/2016, MRI ankle 01/23/2016

CLINICAL DATA: Unstable ankle

EXAM:
MRI OF THE LEFT ANKLE WITHOUT CONTRAST
TECHNIQUE: Multiplanar, multisequence MR imaging of the ankle was performed. No
intravenous contrast was administered.

[Series 3: T1 · sagittal · 4.0mm · 0.21mm/px · 3 of 21 slices shown]
[im 5/21]
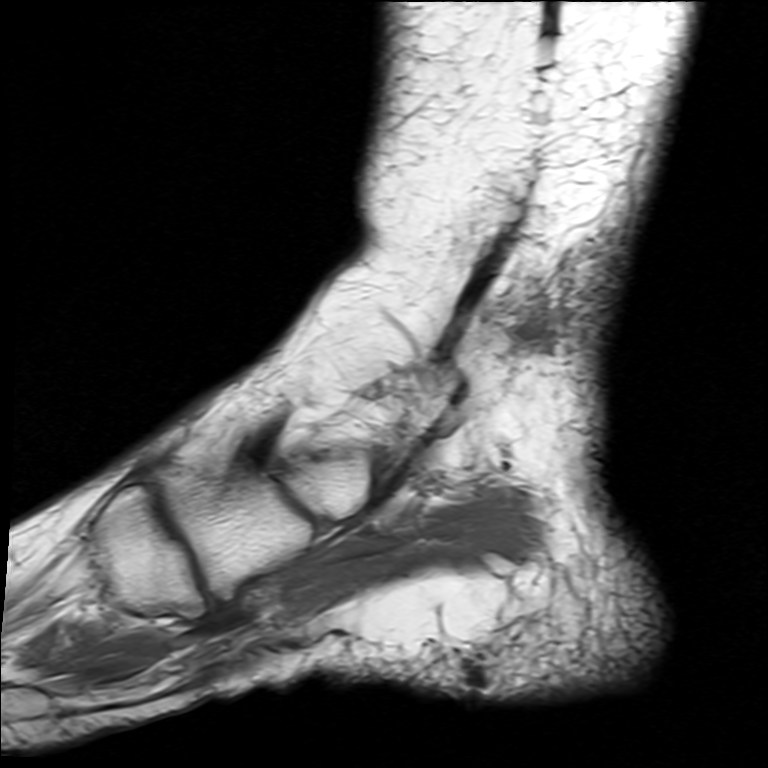
[im 13/21]
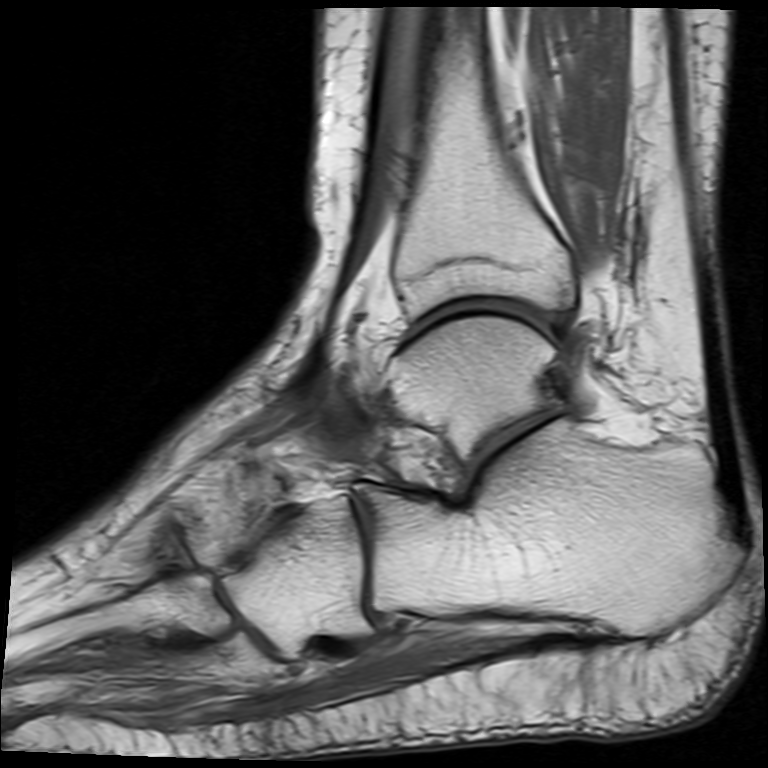
[im 21/21]
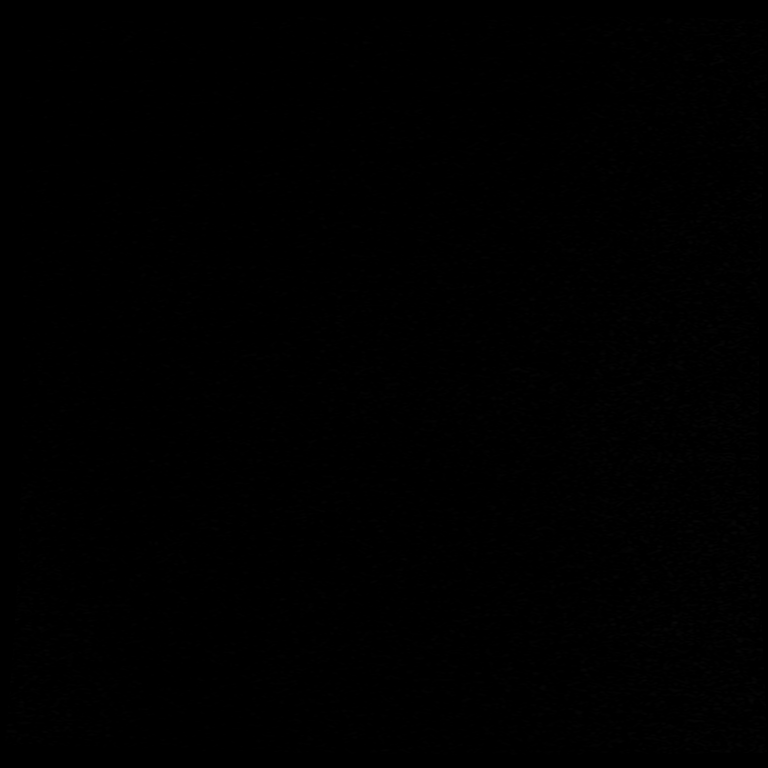

[Series 5: T2 fat-sat · axial · 3.0mm · 0.50mm/px · z∈[-48,+55]mm · 3 of 36 slices shown (1 of 2)]
[im 5/36]
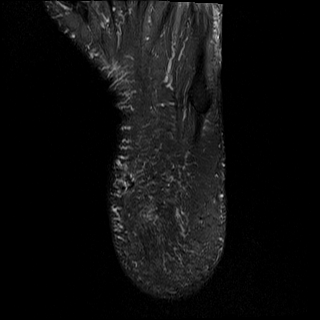
[im 18/36]
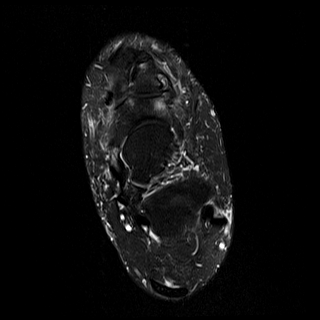
[im 31/36]
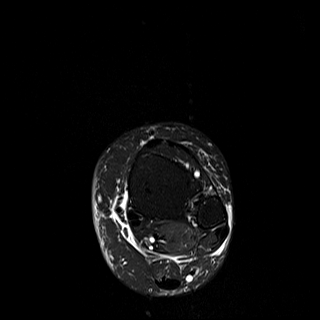

[Series 6: PD fat-sat · axial · 3.0mm · 0.31mm/px · z∈[-64,+55]mm · 8 of 36 slices shown]
[im 1/36]
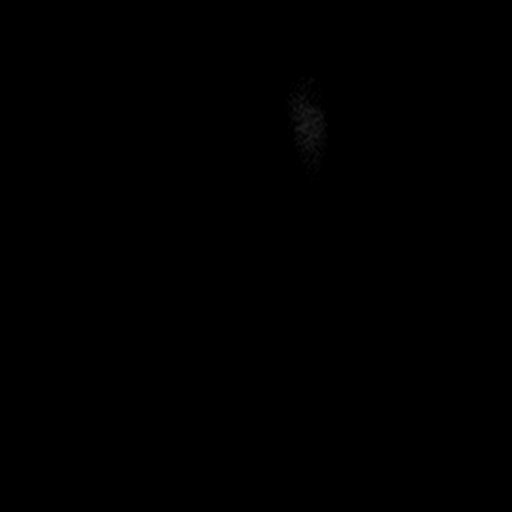
[im 5/36]
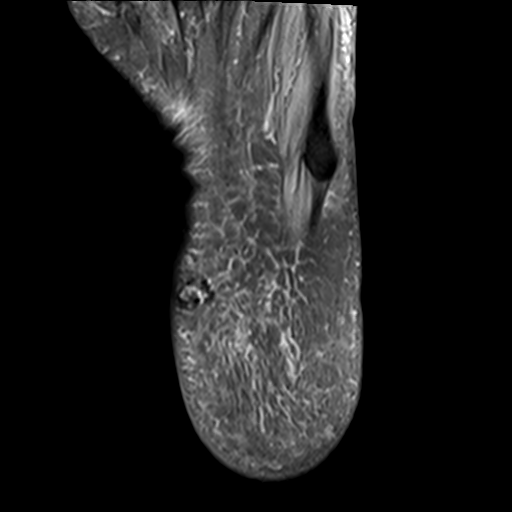
[im 9/36]
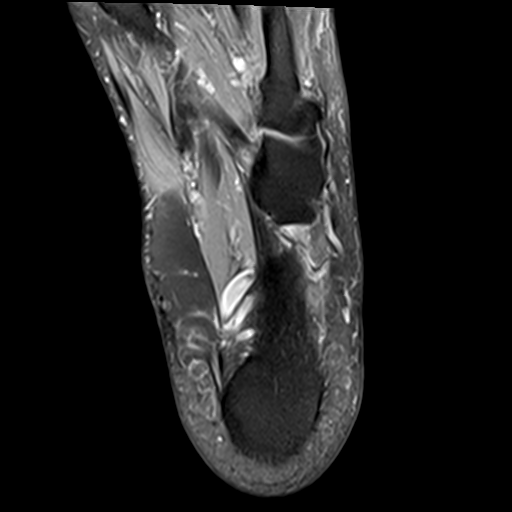
[im 14/36]
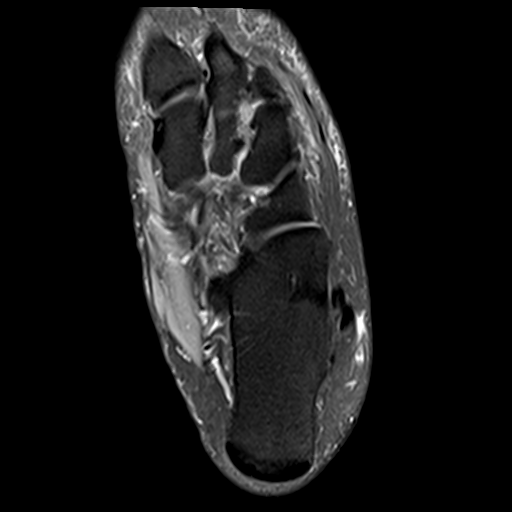
[im 18/36]
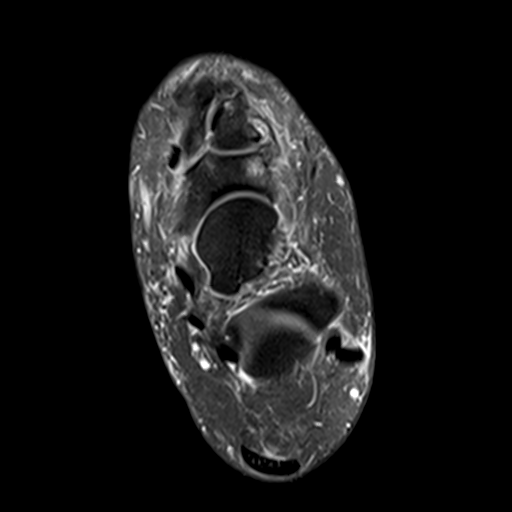
[im 22/36]
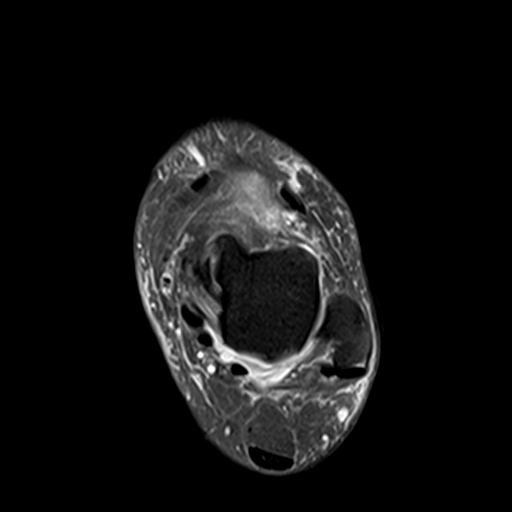
[im 27/36]
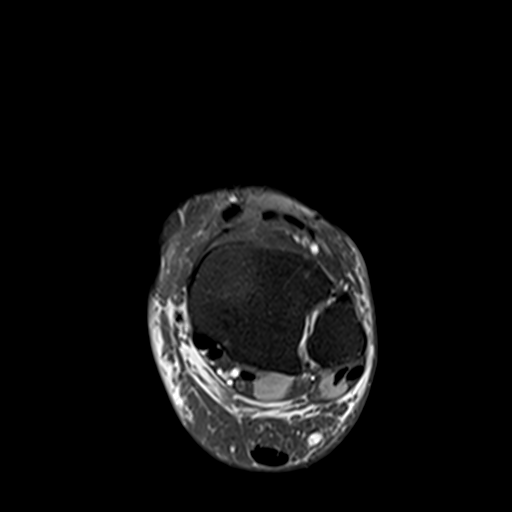
[im 31/36]
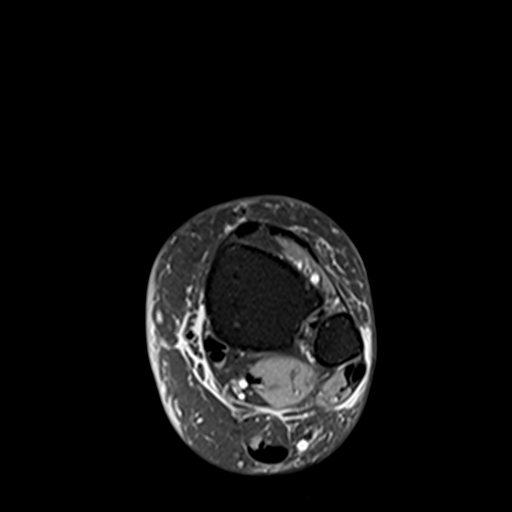

[Series 7: T2 fat-sat · coronal · 3.0mm · 0.31mm/px · 3 of 40 slices shown (2 of 2)]
[im 5/40]
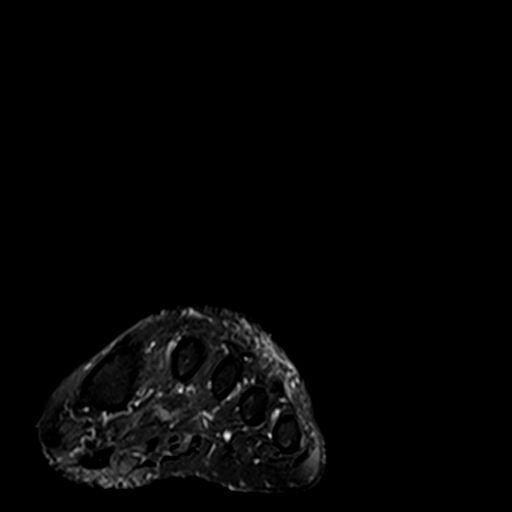
[im 22/40]
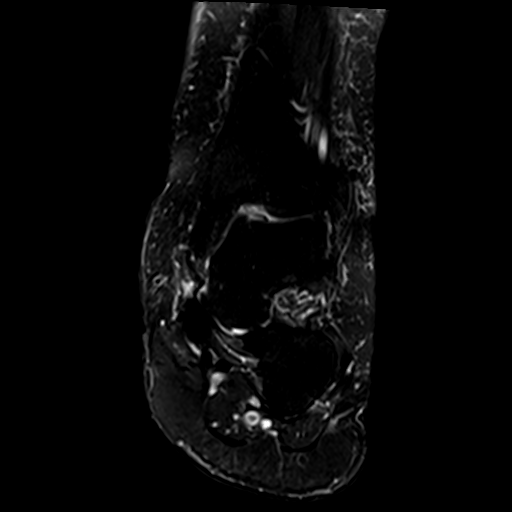
[im 35/40]
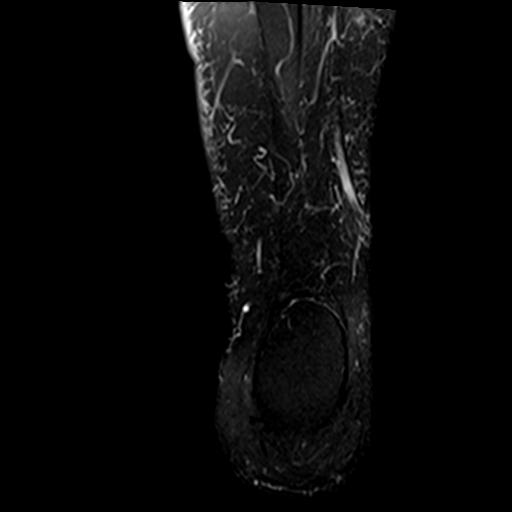

[17 of 40 positions shown; findings below may reference images not displayed]

FINDINGS: TENDONS

Peroneal: Peroneal longus and brevis tendons are intact.

Posteromedial: Posterior tibial tendon intact. Flexor digitorum
longus tendon intact. Flexor hallucis longus tendon intact.

Anterior: Tibialis anterior tendon intact. Extensor hallucis longus
tendon intact Extensor digitorum longus tendon intact.

Achilles:  Intact.

Plantar Fascia: Plantar calcaneal spurring. Thickening of the medial
bundle without increased internal signal.

LIGAMENTS

Lateral: There is thickening mild scarring along the ATFL with mild
laxity. The calcaneofibular ligament is intact. Posterior
talofibular ligament intact. Anterior and posterior tibiofibular
ligaments intact.

Medial: Deltoid ligament intact. Spring ligament intact.

CARTILAGE

Ankle Joint: No joint effusion. Normal ankle mortise. No chondral
defect.

Subtalar Joints/Sinus Tarsi: Normal subtalar joints. No subtalar
joint effusion. Normal sinus tarsi.

Bones: There is mild navicular-cuneiform arthritis. There is mild
second tarsometatarsal joint osteoarthritis.

Soft Tissue: No fluid collection or hematoma. Muscles are normal
without edema or atrophy. Tarsal tunnel is normal. There is mild
ankle soft tissue swelling.
IMPRESSION: Chronic low-grade ATFL sprain, with mild laxity. Intact
calcaneofibular and posterior talofibular ligaments.

Sequela of prior plantar fasciitis involving the proximal medial
bundle.

Mild midfoot arthritis with periarticular bony edema.
